# Patient Record
Sex: Male | Born: 1955 | Race: Black or African American | Hispanic: No | Marital: Married | Smoking: Former smoker
Health system: Southern US, Community
[De-identification: ages and names within clinical notes are randomized; demographics above are authoritative.]

## PROBLEM LIST (undated history)

## (undated) DIAGNOSIS — M109 Gout, unspecified: Secondary | ICD-10-CM

## (undated) DIAGNOSIS — I739 Peripheral vascular disease, unspecified: Secondary | ICD-10-CM

## (undated) DIAGNOSIS — I10 Essential (primary) hypertension: Secondary | ICD-10-CM

## (undated) HISTORY — DX: Peripheral vascular disease, unspecified: I73.9

## (undated) HISTORY — PX: COSMETIC SURGERY: SHX468

## (undated) SURGERY — Surgical Case
Anesthesia: *Unknown

---

## 1999-04-12 ENCOUNTER — Emergency Department (HOSPITAL_COMMUNITY): Admission: EM | Admit: 1999-04-12 | Discharge: 1999-04-12 | Payer: Self-pay | Admitting: Emergency Medicine

## 1999-09-05 ENCOUNTER — Emergency Department (HOSPITAL_COMMUNITY): Admission: EM | Admit: 1999-09-05 | Discharge: 1999-09-05 | Payer: Self-pay | Admitting: Emergency Medicine

## 2004-05-10 ENCOUNTER — Inpatient Hospital Stay (HOSPITAL_COMMUNITY): Admission: EM | Admit: 2004-05-10 | Discharge: 2004-05-11 | Payer: Self-pay | Admitting: Emergency Medicine

## 2004-05-10 ENCOUNTER — Encounter: Payer: Self-pay | Admitting: Emergency Medicine

## 2004-06-05 ENCOUNTER — Ambulatory Visit (HOSPITAL_COMMUNITY): Admission: RE | Admit: 2004-06-05 | Discharge: 2004-06-05 | Payer: Self-pay | Admitting: Otolaryngology

## 2004-06-27 ENCOUNTER — Ambulatory Visit (HOSPITAL_COMMUNITY): Admission: RE | Admit: 2004-06-27 | Discharge: 2004-06-27 | Payer: Self-pay | Admitting: Otolaryngology

## 2004-07-10 ENCOUNTER — Ambulatory Visit (HOSPITAL_BASED_OUTPATIENT_CLINIC_OR_DEPARTMENT_OTHER): Admission: RE | Admit: 2004-07-10 | Discharge: 2004-07-10 | Payer: Self-pay | Admitting: Otolaryngology

## 2004-07-10 ENCOUNTER — Ambulatory Visit (HOSPITAL_COMMUNITY): Admission: RE | Admit: 2004-07-10 | Discharge: 2004-07-10 | Payer: Self-pay | Admitting: Otolaryngology

## 2006-05-21 ENCOUNTER — Ambulatory Visit: Payer: Self-pay | Admitting: Internal Medicine

## 2006-06-21 ENCOUNTER — Ambulatory Visit: Payer: Self-pay | Admitting: Internal Medicine

## 2006-07-29 ENCOUNTER — Ambulatory Visit: Payer: Self-pay | Admitting: *Deleted

## 2007-07-27 ENCOUNTER — Emergency Department (HOSPITAL_COMMUNITY): Admission: EM | Admit: 2007-07-27 | Discharge: 2007-07-27 | Payer: Self-pay | Admitting: Emergency Medicine

## 2007-08-12 ENCOUNTER — Emergency Department (HOSPITAL_COMMUNITY): Admission: EM | Admit: 2007-08-12 | Discharge: 2007-08-12 | Payer: Self-pay | Admitting: Emergency Medicine

## 2009-02-16 ENCOUNTER — Ambulatory Visit: Payer: Self-pay | Admitting: Internal Medicine

## 2009-02-24 ENCOUNTER — Ambulatory Visit: Payer: Self-pay | Admitting: Internal Medicine

## 2009-02-24 LAB — CONVERTED CEMR LAB
BUN: 12 mg/dL (ref 6–23)
Basophils Relative: 1 % (ref 0–1)
Chloride: 103 meq/L (ref 96–112)
Eosinophils Absolute: 0.2 10*3/uL (ref 0.0–0.7)
Glucose, Bld: 99 mg/dL (ref 70–99)
LDL Cholesterol: 204 mg/dL — ABNORMAL HIGH (ref 0–99)
Lymphocytes Relative: 35 % (ref 12–46)
MCHC: 31.7 g/dL (ref 30.0–36.0)
Neutro Abs: 2.2 10*3/uL (ref 1.7–7.7)
Sodium: 139 meq/L (ref 135–145)
Total CHOL/HDL Ratio: 6.9
Total Protein: 6.9 g/dL (ref 6.0–8.3)
Triglycerides: 218 mg/dL — ABNORMAL HIGH (ref <150)
WBC: 4.2 10*3/uL (ref 4.0–10.5)

## 2009-03-03 ENCOUNTER — Ambulatory Visit: Payer: Self-pay | Admitting: Family Medicine

## 2009-05-23 ENCOUNTER — Encounter (INDEPENDENT_AMBULATORY_CARE_PROVIDER_SITE_OTHER): Payer: Self-pay | Admitting: Internal Medicine

## 2009-05-23 ENCOUNTER — Ambulatory Visit: Payer: Self-pay | Admitting: Family Medicine

## 2009-05-23 LAB — CONVERTED CEMR LAB
Cholesterol: 261 mg/dL — ABNORMAL HIGH (ref 0–200)
HDL: 43 mg/dL (ref >39)
LDL Cholesterol: 176 mg/dL — ABNORMAL HIGH (ref 0–99)
Total CHOL/HDL Ratio: 6.1

## 2009-05-30 ENCOUNTER — Ambulatory Visit: Payer: Self-pay | Admitting: Family Medicine

## 2009-08-23 ENCOUNTER — Ambulatory Visit: Payer: Self-pay | Admitting: Internal Medicine

## 2009-08-23 LAB — CONVERTED CEMR LAB
Cholesterol: 264 mg/dL — ABNORMAL HIGH (ref 0–200)
HDL: 40 mg/dL (ref >39)
Triglycerides: 232 mg/dL — ABNORMAL HIGH (ref <150)

## 2009-11-03 ENCOUNTER — Encounter (INDEPENDENT_AMBULATORY_CARE_PROVIDER_SITE_OTHER): Payer: Self-pay | Admitting: Internal Medicine

## 2009-11-03 LAB — CONVERTED CEMR LAB
BUN: 13 mg/dL (ref 6–23)
Cholesterol: 276 mg/dL — ABNORMAL HIGH (ref 0–200)
Creatinine, Ser: 1.17 mg/dL (ref 0.40–1.50)
HDL: 40 mg/dL (ref >39)
LDL Cholesterol: 181 mg/dL — ABNORMAL HIGH (ref 0–99)
Sodium: 140 meq/L (ref 135–145)
Triglycerides: 273 mg/dL — ABNORMAL HIGH (ref <150)
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
## 2014-06-03 ENCOUNTER — Encounter (HOSPITAL_COMMUNITY): Payer: Self-pay | Admitting: *Deleted

## 2014-06-03 ENCOUNTER — Emergency Department (HOSPITAL_COMMUNITY)
Admission: EM | Admit: 2014-06-03 | Discharge: 2014-06-03 | Disposition: A | Discharge status: Home / Self Care | Payer: 59 | Source: Home / Self Care | Attending: Family Medicine | Admitting: Family Medicine

## 2014-06-03 DIAGNOSIS — S39012A Strain of muscle, fascia and tendon of lower back, initial encounter: Secondary | ICD-10-CM

## 2014-06-03 MED ORDER — DICLOFENAC POTASSIUM 50 MG PO TABS: 50.0000 mg | ORAL_TABLET | Freq: Three times a day (TID) | ORAL | Status: DC

## 2014-06-03 MED ORDER — KETOROLAC TROMETHAMINE 30 MG/ML IJ SOLN
30.0000 mg | Freq: Once | INTRAMUSCULAR | Status: AC
  Administered 2014-06-03: 30 mg via INTRAMUSCULAR

## 2014-06-03 MED ORDER — CYCLOBENZAPRINE HCL 5 MG PO TABS: 5.0000 mg | ORAL_TABLET | Freq: Three times a day (TID) | ORAL | Status: DC | PRN

## 2014-06-03 MED ORDER — KETOROLAC TROMETHAMINE 30 MG/ML IJ SOLN
INTRAMUSCULAR | Status: AC
  Filled 2014-06-03: qty 1

## 2014-06-03 NOTE — ED Provider Notes (Signed)
CSN: [id number]     Arrival date & time 06/03/14  1502 History   First MD Initiated Contact with Patient 06/03/14 1605     Chief Complaint  Patient presents with  . Back Pain   (Consider location/radiation/quality/duration/timing/severity/associated sxs/prior Treatment) Patient is a 59 y.o. male presenting with back pain. The history is provided by the patient.  Back Pain Location:  Lumbar spine Quality:  Stiffness and shooting Radiates to:  Does not radiate Pain severity:  Moderate Progression:  Unchanged Chronicity:  New Context: lifting heavy objects   Context comment:  Bending over at work last night and pushing something onto a fork lift and felt pain in right lower back, used heat but still sore today, told he can do light duty at work. Relieved by:  None tried Worsened by:  Nothing tried Ineffective treatments:  Heating pad Associated symptoms: no abdominal pain, no bladder incontinence, no bowel incontinence, no chest pain, no leg pain, no numbness, no paresthesias, no tingling and no weakness     History reviewed. No pertinent past medical history. History reviewed. No pertinent past surgical history. History reviewed. No pertinent family history. History  Substance Use Topics  . Smoking status: Not on file  . Smokeless tobacco: Not on file  . Alcohol Use: No    Review of Systems  Constitutional: Negative.   Cardiovascular: Negative for chest pain.  Gastrointestinal: Negative.  Negative for abdominal pain and bowel incontinence.  Genitourinary: Negative.  Negative for bladder incontinence.  Musculoskeletal: Positive for myalgias, back pain and gait problem. Negative for neck pain.  Skin: Negative.   Neurological: Negative for tingling, weakness, numbness and paresthesias.    Allergies  Review of patient's allergies indicates no known allergies.  Home Medications   Prior to Admission medications   Medication Sig Start Date End Date Taking? Authorizing Provider   cyclobenzaprine (FLEXERIL) 5 MG tablet Take 1 tablet (5 mg total) by mouth 3 (three) times daily as needed for muscle spasms. 06/03/14   Linna HoffJames D Jeshawn Melucci, MD  diclofenac (CATAFLAM) 50 MG tablet Take 1 tablet (50 mg total) by mouth 3 (three) times daily. 06/03/14   Linna HoffJames D Burke Terry, MD   There were no vitals taken for this visit. Physical Exam  Constitutional: He is oriented to person, place, and time. He appears well-developed and well-nourished.  Neck: Normal range of motion. Neck supple.  Pulmonary/Chest: Effort normal and breath sounds normal.  Abdominal: Soft. Bowel sounds are normal.  Musculoskeletal: He exhibits tenderness.       Lumbar back: He exhibits decreased range of motion, tenderness, pain and spasm. He exhibits no bony tenderness, no swelling and normal pulse.       Back:  Lymphadenopathy:    He has no cervical adenopathy.  Neurological: He is alert and oriented to person, place, and time.  Skin: Skin is warm and dry.  Nursing note and vitals reviewed.   ED Course  Procedures (including critical care time) Labs Review Labs Reviewed - No data to display  Imaging Review No results found.   MDM   1. Low back strain, initial encounter        Linna HoffJames D Gibbs Naugle, MD 06/03/14 [phone]

## 2014-06-03 NOTE — ED Notes (Signed)
Pt  Reports  Back  Pain        Yesterday         denys  Any  specefic  Injury                 WALKS   BENT  OVER         WITH    SLOW   STEADY  GAIT

## 2014-06-03 NOTE — Discharge Instructions (Signed)
Heat and medicine and activity as tolerated. Return if needed

## 2014-06-04 LAB — POCT RAPID STREP A: STREPTOCOCCUS, GROUP A SCREEN (DIRECT): NEGATIVE

## 2014-06-26 ENCOUNTER — Emergency Department (HOSPITAL_COMMUNITY)
Admission: EM | Admit: 2014-06-26 | Discharge: 2014-06-26 | Disposition: A | Discharge status: Home / Self Care | Payer: 59 | Attending: Emergency Medicine | Admitting: Emergency Medicine

## 2014-06-26 ENCOUNTER — Emergency Department (HOSPITAL_COMMUNITY): Payer: 59

## 2014-06-26 ENCOUNTER — Encounter (HOSPITAL_COMMUNITY): Payer: Self-pay | Admitting: *Deleted

## 2014-06-26 DIAGNOSIS — Z720 Tobacco use: Secondary | ICD-10-CM | POA: Insufficient documentation

## 2014-06-26 DIAGNOSIS — M79671 Pain in right foot: Secondary | ICD-10-CM | POA: Diagnosis not present

## 2014-06-26 DIAGNOSIS — Z791 Long term (current) use of non-steroidal anti-inflammatories (NSAID): Secondary | ICD-10-CM | POA: Insufficient documentation

## 2014-06-26 MED ORDER — INDOMETHACIN 50 MG PO CAPS: 50.0000 mg | ORAL_CAPSULE | Freq: Three times a day (TID) | ORAL | Status: DC

## 2014-06-26 MED ORDER — PREDNISONE 20 MG PO TABS
60.0000 mg | ORAL_TABLET | Freq: Once | ORAL | Status: AC
  Administered 2014-06-26: 60 mg via ORAL
  Filled 2014-06-26: qty 3

## 2014-06-26 MED ORDER — OXYCODONE-ACETAMINOPHEN 5-325 MG PO TABS
1.0000 | ORAL_TABLET | Freq: Once | ORAL | Status: AC
  Administered 2014-06-26: 1 via ORAL
  Filled 2014-06-26: qty 1

## 2014-06-26 MED ORDER — COLCHICINE 0.6 MG PO TABS: 0.6000 mg | ORAL_TABLET | Freq: Every day | ORAL | Status: DC
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
## 2014-06-26 NOTE — ED Notes (Signed)
Pt c/o pain to R great toe since yesterday.  Joint swollen, red and warm.

## 2014-06-26 NOTE — ED Notes (Signed)
Pt stable, ambulatory, states understanding of discharge instructions 

## 2014-06-26 NOTE — Discharge Instructions (Signed)

## 2016-05-22 ENCOUNTER — Emergency Department (HOSPITAL_COMMUNITY): Payer: BLUE CROSS/BLUE SHIELD

## 2016-05-22 ENCOUNTER — Encounter (HOSPITAL_COMMUNITY): Payer: Self-pay | Admitting: Emergency Medicine

## 2016-05-22 ENCOUNTER — Emergency Department (HOSPITAL_COMMUNITY)
Admission: EM | Admit: 2016-05-22 | Discharge: 2016-05-23 | Disposition: A | Discharge status: Home / Self Care | Payer: BLUE CROSS/BLUE SHIELD | Attending: Emergency Medicine | Admitting: Emergency Medicine

## 2016-05-22 DIAGNOSIS — M79671 Pain in right foot: Secondary | ICD-10-CM | POA: Diagnosis not present

## 2016-05-22 DIAGNOSIS — F17210 Nicotine dependence, cigarettes, uncomplicated: Secondary | ICD-10-CM | POA: Diagnosis not present

## 2016-05-22 DIAGNOSIS — I10 Essential (primary) hypertension: Secondary | ICD-10-CM | POA: Diagnosis not present

## 2016-05-22 DIAGNOSIS — Z79899 Other long term (current) drug therapy: Secondary | ICD-10-CM | POA: Insufficient documentation

## 2016-05-22 HISTORY — DX: Gout, unspecified: M10.9

## 2016-05-22 HISTORY — DX: Essential (primary) hypertension: I10

## 2016-05-22 MED ORDER — INDOMETHACIN 50 MG PO CAPS: 50.0000 mg | ORAL_CAPSULE | Freq: Two times a day (BID) | ORAL | 0 refills | Status: DC

## 2016-05-22 MED ORDER — HYDROCODONE-ACETAMINOPHEN 5-325 MG PO TABS
1.0000 | ORAL_TABLET | Freq: Once | ORAL | Status: AC
Start: 2016-05-22 — End: 2016-05-22
  Administered 2016-05-22: 1 via ORAL
  Filled 2016-05-22: qty 1

## 2016-05-22 MED ORDER — COLCHICINE 0.6 MG PO TABS: 0.6000 mg | ORAL_TABLET | Freq: Every day | ORAL | 0 refills | Status: DC

## 2016-05-22 NOTE — ED Notes (Signed)
Patient transported to X-ray 
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
## 2016-05-22 NOTE — ED Triage Notes (Signed)
Pt c/o 9/10 right foot pain on the great toe, pt states he has hx of gout and feels like that pain, denies any injury.

## 2016-08-11 ENCOUNTER — Ambulatory Visit (HOSPITAL_COMMUNITY)
Admission: EM | Admit: 2016-08-11 | Discharge: 2016-08-11 | Disposition: A | Discharge status: Home / Self Care | Payer: BLUE CROSS/BLUE SHIELD | Attending: Internal Medicine | Admitting: Internal Medicine

## 2016-08-11 ENCOUNTER — Encounter (HOSPITAL_COMMUNITY): Payer: Self-pay

## 2016-08-11 DIAGNOSIS — S39012A Strain of muscle, fascia and tendon of lower back, initial encounter: Secondary | ICD-10-CM

## 2016-08-11 DIAGNOSIS — M79604 Pain in right leg: Secondary | ICD-10-CM

## 2016-08-11 MED ORDER — KETOROLAC TROMETHAMINE 60 MG/2ML IM SOLN
INTRAMUSCULAR | Status: AC
  Filled 2016-08-11: qty 2

## 2016-08-11 MED ORDER — CYCLOBENZAPRINE HCL 10 MG PO TABS: 10.0000 mg | ORAL_TABLET | Freq: Every day | ORAL | 0 refills | Status: DC

## 2016-08-11 MED ORDER — NAPROXEN 500 MG PO TABS: 500.0000 mg | ORAL_TABLET | Freq: Two times a day (BID) | ORAL | 0 refills | Status: AC

## 2016-08-11 MED ORDER — KETOROLAC TROMETHAMINE 60 MG/2ML IM SOLN
60.0000 mg | Freq: Once | INTRAMUSCULAR | Status: AC
  Administered 2016-08-11: 60 mg via INTRAMUSCULAR
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
## 2016-08-11 NOTE — Discharge Instructions (Signed)
Your exam is most consistent with muscle strain. Start naproxen 500 mg twice a day for 10 days. Start Flexeril 10 mg at night as needed for muscle spasms. Back brace to help with the pain. Activity as tolerated. Muscle strains can take up to 2-3 weeks to resolve, but he should be feeling better each day. Follow-up with PCP if symptoms do not resolve. Monitor for loss of bladder or bowel control, numbness of the inner thighs, to go to the ED immediately.

## 2016-08-11 NOTE — ED Triage Notes (Signed)
Patient presents today with right leg pain that started 2 days ago when picking up a board. Felt a little pain on the right lower part of his back when he was bending over and the next day the pain was shooting down his right leg. Has tried OTC tylenol that does not help with relief. Can barely walk without pain and has been unable to sleep for the past 2 nights.

## 2019-10-05 ENCOUNTER — Encounter (HOSPITAL_COMMUNITY): Payer: Self-pay | Admitting: Urgent Care

## 2019-10-05 ENCOUNTER — Ambulatory Visit (INDEPENDENT_AMBULATORY_CARE_PROVIDER_SITE_OTHER): Payer: 59

## 2019-10-05 ENCOUNTER — Ambulatory Visit (HOSPITAL_COMMUNITY)
Admission: EM | Admit: 2019-10-05 | Discharge: 2019-10-05 | Disposition: A | Discharge status: Home / Self Care | Payer: 59 | Attending: Urgent Care | Admitting: Urgent Care

## 2019-10-05 ENCOUNTER — Other Ambulatory Visit: Payer: Self-pay

## 2019-10-05 DIAGNOSIS — M7989 Other specified soft tissue disorders: Secondary | ICD-10-CM | POA: Diagnosis not present

## 2019-10-05 DIAGNOSIS — M79675 Pain in left toe(s): Secondary | ICD-10-CM | POA: Diagnosis not present

## 2019-10-05 DIAGNOSIS — Z8739 Personal history of other diseases of the musculoskeletal system and connective tissue: Secondary | ICD-10-CM

## 2019-10-05 MED ORDER — PREDNISONE 20 MG PO TABS: ORAL_TABLET | ORAL | 0 refills | Status: DC

## 2019-10-05 NOTE — ED Triage Notes (Signed)
Pt presents with left little toe pain x 1 month. Pain worsen when walking.
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
## 2019-12-08 ENCOUNTER — Emergency Department (HOSPITAL_COMMUNITY)
Admission: EM | Admit: 2019-12-08 | Discharge: 2019-12-09 | Disposition: A | Discharge status: Home / Self Care | Payer: 59 | Attending: Emergency Medicine | Admitting: Emergency Medicine

## 2019-12-08 ENCOUNTER — Other Ambulatory Visit: Payer: Self-pay

## 2019-12-08 DIAGNOSIS — I739 Peripheral vascular disease, unspecified: Secondary | ICD-10-CM

## 2019-12-08 DIAGNOSIS — F17210 Nicotine dependence, cigarettes, uncomplicated: Secondary | ICD-10-CM | POA: Insufficient documentation

## 2019-12-08 DIAGNOSIS — Z20822 Contact with and (suspected) exposure to covid-19: Secondary | ICD-10-CM | POA: Insufficient documentation

## 2019-12-08 DIAGNOSIS — S91105D Unspecified open wound of left lesser toe(s) without damage to nail, subsequent encounter: Secondary | ICD-10-CM | POA: Insufficient documentation

## 2019-12-08 DIAGNOSIS — X58XXXD Exposure to other specified factors, subsequent encounter: Secondary | ICD-10-CM | POA: Insufficient documentation

## 2019-12-08 DIAGNOSIS — T148XXA Other injury of unspecified body region, initial encounter: Secondary | ICD-10-CM

## 2019-12-08 DIAGNOSIS — S91109A Unspecified open wound of unspecified toe(s) without damage to nail, initial encounter: Secondary | ICD-10-CM

## 2019-12-08 DIAGNOSIS — I10 Essential (primary) hypertension: Secondary | ICD-10-CM | POA: Diagnosis not present

## 2019-12-08 DIAGNOSIS — I998 Other disorder of circulatory system: Secondary | ICD-10-CM | POA: Diagnosis not present

## 2019-12-08 DIAGNOSIS — I70222 Atherosclerosis of native arteries of extremities with rest pain, left leg: Secondary | ICD-10-CM | POA: Diagnosis not present

## 2019-12-08 NOTE — ED Triage Notes (Signed)
Onset 1 month sore between left pinky toe and 4th toe.  Was seen for same.  Pt now c/os of foot pain all over.  Great toe and 2nd toe reddened, swollen.   + pedal pulse.  Left lower leg feels cooler to touch than right lower leg.

## 2019-12-09 ENCOUNTER — Other Ambulatory Visit: Payer: Self-pay

## 2019-12-09 ENCOUNTER — Emergency Department (HOSPITAL_COMMUNITY): Payer: 59

## 2019-12-09 DIAGNOSIS — I998 Other disorder of circulatory system: Secondary | ICD-10-CM

## 2019-12-09 DIAGNOSIS — I70222 Atherosclerosis of native arteries of extremities with rest pain, left leg: Secondary | ICD-10-CM

## 2019-12-09 LAB — BASIC METABOLIC PANEL
Anion gap: 10 (ref 5–15)
BUN: 13 mg/dL (ref 8–23)
CO2: 25 mmol/L (ref 22–32)
Calcium: 9.2 mg/dL (ref 8.9–10.3)
Chloride: 104 mmol/L (ref 98–111)
Creatinine, Ser: 1.08 mg/dL (ref 0.61–1.24)
GFR, Estimated: >60 mL/min (ref >60)
Glucose, Bld: 99 mg/dL (ref 70–99)
Potassium: 3.8 mmol/L (ref 3.5–5.1)
Sodium: 139 mmol/L (ref 135–145)

## 2019-12-09 LAB — CBC WITH DIFFERENTIAL/PLATELET
Abs Immature Granulocytes: 0.01 10*3/uL (ref 0.00–0.07)
Basophils Absolute: 0.1 10*3/uL (ref 0.0–0.1)
Basophils Relative: 1 %
Eosinophils Absolute: 0.3 10*3/uL (ref 0.0–0.5)
Eosinophils Relative: 3 %
HCT: 39.9 % (ref 39.0–52.0)
Hemoglobin: 12.9 g/dL — ABNORMAL LOW (ref 13.0–17.0)
Immature Granulocytes: 0 %
Lymphocytes Relative: 34 %
Lymphs Abs: 2.6 10*3/uL (ref 0.7–4.0)
MCH: 31.1 pg (ref 26.0–34.0)
MCHC: 32.3 g/dL (ref 30.0–36.0)
MCV: 96.1 fL (ref 80.0–100.0)
Monocytes Absolute: 0.4 10*3/uL (ref 0.1–1.0)
Monocytes Relative: 5 %
Neutro Abs: 4.3 10*3/uL (ref 1.7–7.7)
Neutrophils Relative %: 57 %
Platelets: 319 10*3/uL (ref 150–400)
RBC: 4.15 MIL/uL — ABNORMAL LOW (ref 4.22–5.81)
RDW: 13.6 % (ref 11.5–15.5)
WBC: 7.6 10*3/uL (ref 4.0–10.5)
nRBC: 0 % (ref 0.0–0.2)

## 2019-12-09 LAB — RESPIRATORY PANEL BY RT PCR (FLU A&B, COVID)
Influenza A by PCR: NEGATIVE
Influenza B by PCR: NEGATIVE
SARS Coronavirus 2 by RT PCR: NEGATIVE

## 2019-12-09 LAB — LACTIC ACID, PLASMA: Lactic Acid, Venous: 1 mmol/L (ref 0.5–1.9)

## 2019-12-09 MED ORDER — OXYCODONE-ACETAMINOPHEN 5-325 MG PO TABS
1.0000 | ORAL_TABLET | Freq: Once | ORAL | Status: AC
  Administered 2019-12-09: 1 via ORAL
  Filled 2019-12-09: qty 1

## 2019-12-09 MED ORDER — OXYCODONE-ACETAMINOPHEN 5-325 MG PO TABS
1.0000 | ORAL_TABLET | Freq: Four times a day (QID) | ORAL | 0 refills | Status: DC | PRN
Start: 2019-12-09 — End: 2019-12-10

## 2019-12-09 NOTE — ED Notes (Signed)
Pt transported to XRAY °
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
## 2019-12-09 NOTE — H&P (View-Only) (Signed)
Vascular and Vein Specialist  Patient name: [name] MRN: [id number] DOB: 08-Nov-1955 Sex: male  REASON FOR CONSULT: Rest pain and tissue loss left foot  HPI: [name] is a 64 y.o. male, presented to the emergency room with increasing pain in his left foot.  This is been present for approximately 1-1/2 to 2 months.  He had presented to urgent care in mid September with similar issues and was felt to have gout.  He does have a history of gout.  This has been progressive and he is now developed an ulceration on the tip of his left fifth toe.  His pain has become increasingly severe and he presented for reevaluation.  He reports classic rest pain.  He is unable to sleep and has to dangle his foot over the edge of the bed.  He has no symptoms in his left leg.  He has no history of cardiac disease.  Does have a prior history of hypertension and is a long-term cigarette smoker.  Past Medical History:  Diagnosis Date  . Gout   . Hypertension     No family history on file.  SOCIAL HISTORY: Social History   Socioeconomic History  . Marital status: Legally Separated    Spouse name: Not on file  . Number of children: Not on file  . Years of education: Not on file  . Highest education level: Not on file  Occupational History  . Not on file  Tobacco Use  . Smoking status: Current Every Day Smoker    Packs/day: 1.00    Types: Cigarettes  . Smokeless tobacco: Never Used  Vaping Use  . Vaping Use: Never used  Substance and Sexual Activity  . Alcohol use: No  . Drug use: No    Comment: Pt vapes   . Sexual activity: Not on file  Other Topics Concern  . Not on file  Social History Narrative  . Not on file   Social Determinants of Health   Financial Resource Strain:   . Difficulty of Paying Living Expenses: Not on file  Food Insecurity:   . Worried About Programme researcher, broadcasting/film/video in the Last Year: Not on file  . Ran Out of Food in the Last Year: Not on file  Transportation  Needs:   . Lack of Transportation (Medical): Not on file  . Lack of Transportation (Non-Medical): Not on file  Physical Activity:   . Days of Exercise per Week: Not on file  . Minutes of Exercise per Session: Not on file  Stress:   . Feeling of Stress : Not on file  Social Connections:   . Frequency of Communication with Friends and Family: Not on file  . Frequency of Social Gatherings with Friends and Family: Not on file  . Attends Religious Services: Not on file  . Active Member of Clubs or Organizations: Not on file  . Attends Banker Meetings: Not on file  . Marital Status: Not on file  Intimate Partner Violence:   . Fear of Current or Ex-Partner: Not on file  . Emotionally Abused: Not on file  . Physically Abused: Not on file  . Sexually Abused: Not on file    No Known Allergies  No current facility-administered medications for this encounter.   Current Outpatient Medications  Medication Sig Dispense Refill  . colchicine 0.6 MG tablet Take 1 tablet (0.6 mg total) by mouth daily. 10 tablet 0  . cyclobenzaprine (FLEXERIL) 10 MG  tablet Take 1 tablet (10 mg total) by mouth at bedtime. 10 tablet 0  . indomethacin (INDOCIN) 50 MG capsule Take 1 capsule (50 mg total) by mouth 2 (two) times daily with a meal. 20 capsule 0  . predniSONE (DELTASONE) 20 MG tablet Take 2 tablets daily with breakfast. 10 tablet 0    REVIEW OF SYSTEMS:  [X]  denotes positive finding, [ ]  denotes negative finding Cardiac  Comments:  Chest pain or chest pressure:    Shortness of breath upon exertion:    Short of breath when lying flat:    Irregular heart rhythm:        Vascular    Pain in calf, thigh, or hip brought on by ambulation: x   Pain in feet at night that wakes you up from your sleep:  x   Blood clot in your veins:    Leg swelling:         Pulmonary    Oxygen at home:    Productive cough:     Wheezing:         Neurologic    Sudden weakness in arms or legs:     Sudden  numbness in arms or legs:     Sudden onset of difficulty speaking or slurred speech:    Temporary loss of vision in one eye:     Problems with dizziness:         Gastrointestinal    Blood in stool:     Vomited blood:         Genitourinary    Burning when urinating:     Blood in urine:        Psychiatric    Major depression:         Hematologic    Bleeding problems:    Problems with blood clotting too easily:        Skin    Rashes or ulcers:        Constitutional    Fever or chills:      PHYSICAL EXAM: Vitals:   12/08/19 2157 12/09/19 0122 12/09/19 0520  BP: (!) 149/87 (!) 133/95 (!) 149/103  Pulse: 81 67 66  Resp: 16 18 (!) 22  Temp: 97.9 F (36.6 C)  97.7 F (36.5 C)  TempSrc: Oral  Oral  SpO2: 99% 100% 98%    GENERAL: The patient is a well-nourished male, in no acute distress. The vital signs are documented above. VASCULAR: 2+ radial pulses bilaterally.  2+ right femoral pulse.  I do not palpate right popliteal or distal pulses.  He does have absent femoral pulse on the left. PULMONARY: There is good air exchange ABDOMEN: Soft and non-tender  MUSCULOSKELETAL: There are no major deformities or cyanosis. NEUROLOGIC: No focal weakness or paresthesias are detected. SKIN: Dependent rubor in his left foot.  He does have a superficial ulceration on the tip of his left fifth toe PSYCHIATRIC: The patient has a normal affect.  DATA:   Normal creatinine  MEDICAL ISSUES:  Critical limb ischemia which has been progressive.  This is not acute.  He will be discharged from the emergency room today with narcotic pain medication provided by Dr. 13/10/21.  My office will contact him later today.  It is currently 6 AM.  My office will contact him later today and we will get him on the schedule most likely tomorrow or Friday.  He will be Covid tested today for procedure tomorrow.  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  Explained the procedure of diagnostic arteriogram most likely through the right femoral approach and hopeful endovascular treatment of his iliac disease.  Also explained may require bypass and this would be determined by his arteriogram.   Larina Earthly, MD FACS [facility] [place] Office phone [phone]

## 2019-12-09 NOTE — Discharge Instructions (Addendum)
Follow-up with Dr. Bosie Helper office as instructed.

## 2019-12-09 NOTE — Consult Note (Signed)
Vascular and Vein Specialist  Patient name: [name] MRN: [id number] DOB: 08-Nov-1955 Sex: male  REASON FOR CONSULT: Rest pain and tissue loss left foot  HPI: [name] is a 64 y.o. male, presented to the emergency room with increasing pain in his left foot.  This is been present for approximately 1-1/2 to 2 months.  He had presented to urgent care in mid September with similar issues and was felt to have gout.  He does have a history of gout.  This has been progressive and he is now developed an ulceration on the tip of his left fifth toe.  His pain has become increasingly severe and he presented for reevaluation.  He reports classic rest pain.  He is unable to sleep and has to dangle his foot over the edge of the bed.  He has no symptoms in his left leg.  He has no history of cardiac disease.  Does have a prior history of hypertension and is a long-term cigarette smoker.  Past Medical History:  Diagnosis Date  . Gout   . Hypertension     No family history on file.  SOCIAL HISTORY: Social History   Socioeconomic History  . Marital status: Legally Separated    Spouse name: Not on file  . Number of children: Not on file  . Years of education: Not on file  . Highest education level: Not on file  Occupational History  . Not on file  Tobacco Use  . Smoking status: Current Every Day Smoker    Packs/day: 1.00    Types: Cigarettes  . Smokeless tobacco: Never Used  Vaping Use  . Vaping Use: Never used  Substance and Sexual Activity  . Alcohol use: No  . Drug use: No    Comment: Pt vapes   . Sexual activity: Not on file  Other Topics Concern  . Not on file  Social History Narrative  . Not on file   Social Determinants of Health   Financial Resource Strain:   . Difficulty of Paying Living Expenses: Not on file  Food Insecurity:   . Worried About Programme researcher, broadcasting/film/video in the Last Year: Not on file  . Ran Out of Food in the Last Year: Not on file  Transportation  Needs:   . Lack of Transportation (Medical): Not on file  . Lack of Transportation (Non-Medical): Not on file  Physical Activity:   . Days of Exercise per Week: Not on file  . Minutes of Exercise per Session: Not on file  Stress:   . Feeling of Stress : Not on file  Social Connections:   . Frequency of Communication with Friends and Family: Not on file  . Frequency of Social Gatherings with Friends and Family: Not on file  . Attends Religious Services: Not on file  . Active Member of Clubs or Organizations: Not on file  . Attends Banker Meetings: Not on file  . Marital Status: Not on file  Intimate Partner Violence:   . Fear of Current or Ex-Partner: Not on file  . Emotionally Abused: Not on file  . Physically Abused: Not on file  . Sexually Abused: Not on file    No Known Allergies  No current facility-administered medications for this encounter.   Current Outpatient Medications  Medication Sig Dispense Refill  . colchicine 0.6 MG tablet Take 1 tablet (0.6 mg total) by mouth daily. 10 tablet 0  . cyclobenzaprine (FLEXERIL) 10 MG  tablet Take 1 tablet (10 mg total) by mouth at bedtime. 10 tablet 0  . indomethacin (INDOCIN) 50 MG capsule Take 1 capsule (50 mg total) by mouth 2 (two) times daily with a meal. 20 capsule 0  . predniSONE (DELTASONE) 20 MG tablet Take 2 tablets daily with breakfast. 10 tablet 0    REVIEW OF SYSTEMS:  [X]  denotes positive finding, [ ]  denotes negative finding Cardiac  Comments:  Chest pain or chest pressure:    Shortness of breath upon exertion:    Short of breath when lying flat:    Irregular heart rhythm:        Vascular    Pain in calf, thigh, or hip brought on by ambulation: x   Pain in feet at night that wakes you up from your sleep:  x   Blood clot in your veins:    Leg swelling:         Pulmonary    Oxygen at home:    Productive cough:     Wheezing:         Neurologic    Sudden weakness in arms or legs:     Sudden  numbness in arms or legs:     Sudden onset of difficulty speaking or slurred speech:    Temporary loss of vision in one eye:     Problems with dizziness:         Gastrointestinal    Blood in stool:     Vomited blood:         Genitourinary    Burning when urinating:     Blood in urine:        Psychiatric    Major depression:         Hematologic    Bleeding problems:    Problems with blood clotting too easily:        Skin    Rashes or ulcers:        Constitutional    Fever or chills:      PHYSICAL EXAM: Vitals:   12/08/19 2157 12/09/19 0122 12/09/19 0520  BP: (!) 149/87 (!) 133/95 (!) 149/103  Pulse: 81 67 66  Resp: 16 18 (!) 22  Temp: 97.9 F (36.6 C)  97.7 F (36.5 C)  TempSrc: Oral  Oral  SpO2: 99% 100% 98%    GENERAL: The patient is a well-nourished male, in no acute distress. The vital signs are documented above. VASCULAR: 2+ radial pulses bilaterally.  2+ right femoral pulse.  I do not palpate right popliteal or distal pulses.  He does have absent femoral pulse on the left. PULMONARY: There is good air exchange ABDOMEN: Soft and non-tender  MUSCULOSKELETAL: There are no major deformities or cyanosis. NEUROLOGIC: No focal weakness or paresthesias are detected. SKIN: Dependent rubor in his left foot.  He does have a superficial ulceration on the tip of his left fifth toe PSYCHIATRIC: The patient has a normal affect.  DATA:   Normal creatinine  MEDICAL ISSUES:  Critical limb ischemia which has been progressive.  This is not acute.  He will be discharged from the emergency room today with narcotic pain medication provided by Dr. 13/10/21.  My office will contact him later today.  It is currently 6 AM.  My office will contact him later today and we will get him on the schedule most likely tomorrow or Friday.  He will be Covid tested today for procedure tomorrow.  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  Explained the procedure of diagnostic arteriogram most likely through the right femoral approach and hopeful endovascular treatment of his iliac disease.  Also explained may require bypass and this would be determined by his arteriogram.   Larina Earthly, MD FACS [facility] [place] Office phone [phone]

## 2019-12-10 ENCOUNTER — Other Ambulatory Visit: Payer: Self-pay

## 2019-12-10 ENCOUNTER — Encounter (HOSPITAL_COMMUNITY)
Admission: RE | Disposition: A | Discharge status: Home / Self Care | Payer: Self-pay | Source: Home / Self Care | Attending: Vascular Surgery

## 2019-12-10 ENCOUNTER — Other Ambulatory Visit: Payer: Self-pay | Admitting: Vascular Surgery

## 2019-12-10 ENCOUNTER — Ambulatory Visit (HOSPITAL_COMMUNITY)
Admission: RE | Admit: 2019-12-10 | Discharge: 2019-12-10 | Disposition: A | Discharge status: Home / Self Care | Payer: 59 | Attending: Vascular Surgery | Admitting: Vascular Surgery

## 2019-12-10 DIAGNOSIS — Z539 Procedure and treatment not carried out, unspecified reason: Secondary | ICD-10-CM | POA: Diagnosis not present

## 2019-12-10 LAB — POCT I-STAT, CHEM 8
BUN: 15 mg/dL (ref 8–23)
Calcium, Ion: 1.27 mmol/L (ref 1.15–1.40)
Chloride: 103 mmol/L (ref 98–111)
Creatinine, Ser: 1.1 mg/dL (ref 0.61–1.24)
Glucose, Bld: 101 mg/dL — ABNORMAL HIGH (ref 70–99)
HCT: 38 % — ABNORMAL LOW (ref 39.0–52.0)
Hemoglobin: 12.9 g/dL — ABNORMAL LOW (ref 13.0–17.0)
Potassium: 3.5 mmol/L (ref 3.5–5.1)
Sodium: 141 mmol/L (ref 135–145)
TCO2: 24 mmol/L (ref 22–32)

## 2019-12-10 SURGERY — ABDOMINAL AORTOGRAM W/LOWER EXTREMITY
Anesthesia: LOCAL

## 2019-12-10 MED ORDER — HYDROMORPHONE HCL 1 MG/ML IJ SOLN: 0.5000 mg | Freq: Once | INTRAMUSCULAR | Status: DC

## 2019-12-10 MED ORDER — SODIUM CHLORIDE 0.9 % IV SOLN: INTRAVENOUS | Status: DC

## 2019-12-10 MED ORDER — OXYCODONE-ACETAMINOPHEN 5-325 MG PO TABS: 1.0000 | ORAL_TABLET | Freq: Four times a day (QID) | ORAL | 0 refills | Status: DC | PRN

## 2019-12-10 NOTE — Progress Notes (Addendum)
I have offered to re-schedule his arteriogram for tomorrow with Dr. Edilia Bo.  I have an emergency on call and have several cases in front of him as well.  He is willing to come back tomorrow morning.  I have discussed with my office to re-schedule.  Cephus Shelling, MD [facility] [place] Office: [phone]   Cephus Shelling

## 2019-12-10 NOTE — Progress Notes (Signed)
15 percocets sent to pharmacy.  Cephus Shelling, MD [facility] [place] Office: [phone]   Cephus Shelling

## 2019-12-11 ENCOUNTER — Encounter (HOSPITAL_COMMUNITY)
Admission: RE | Disposition: A | Discharge status: Home / Self Care | Payer: Self-pay | Source: Home / Self Care | Attending: Vascular Surgery

## 2019-12-11 ENCOUNTER — Encounter: Payer: Self-pay | Admitting: Vascular Surgery

## 2019-12-11 ENCOUNTER — Other Ambulatory Visit: Payer: Self-pay

## 2019-12-11 ENCOUNTER — Ambulatory Visit (HOSPITAL_COMMUNITY)
Admission: RE | Admit: 2019-12-11 | Discharge: 2019-12-11 | Disposition: A | Discharge status: Home / Self Care | Payer: 59 | Attending: Vascular Surgery | Admitting: Vascular Surgery

## 2019-12-11 DIAGNOSIS — Z79899 Other long term (current) drug therapy: Secondary | ICD-10-CM | POA: Diagnosis not present

## 2019-12-11 DIAGNOSIS — F17210 Nicotine dependence, cigarettes, uncomplicated: Secondary | ICD-10-CM | POA: Diagnosis not present

## 2019-12-11 DIAGNOSIS — Z7952 Long term (current) use of systemic steroids: Secondary | ICD-10-CM | POA: Diagnosis not present

## 2019-12-11 DIAGNOSIS — I70245 Atherosclerosis of native arteries of left leg with ulceration of other part of foot: Secondary | ICD-10-CM | POA: Diagnosis present

## 2019-12-11 DIAGNOSIS — I10 Essential (primary) hypertension: Secondary | ICD-10-CM | POA: Diagnosis not present

## 2019-12-11 DIAGNOSIS — M109 Gout, unspecified: Secondary | ICD-10-CM | POA: Diagnosis not present

## 2019-12-11 DIAGNOSIS — L97529 Non-pressure chronic ulcer of other part of left foot with unspecified severity: Secondary | ICD-10-CM | POA: Insufficient documentation

## 2019-12-11 DIAGNOSIS — I998 Other disorder of circulatory system: Secondary | ICD-10-CM | POA: Diagnosis not present

## 2019-12-11 HISTORY — PX: ABDOMINAL AORTOGRAM W/LOWER EXTREMITY: CATH118223

## 2019-12-11 SURGERY — ABDOMINAL AORTOGRAM W/LOWER EXTREMITY
Anesthesia: LOCAL | Laterality: Bilateral

## 2019-12-11 MED ORDER — MIDAZOLAM HCL 2 MG/2ML IJ SOLN
INTRAMUSCULAR | Status: AC
  Filled 2019-12-11: qty 2

## 2019-12-11 MED ORDER — ONDANSETRON HCL 4 MG/2ML IJ SOLN: 4.0000 mg | Freq: Four times a day (QID) | INTRAMUSCULAR | Status: DC | PRN

## 2019-12-11 MED ORDER — LABETALOL HCL 5 MG/ML IV SOLN: 10.0000 mg | INTRAVENOUS | Status: DC | PRN

## 2019-12-11 MED ORDER — IODIXANOL 320 MG/ML IV SOLN
INTRAVENOUS | Status: DC | PRN
  Administered 2019-12-11: 193 mL via INTRA_ARTERIAL

## 2019-12-11 MED ORDER — HEPARIN (PORCINE) IN NACL 1000-0.9 UT/500ML-% IV SOLN
INTRAVENOUS | Status: AC
  Filled 2019-12-11: qty 1000

## 2019-12-11 MED ORDER — HYDRALAZINE HCL 20 MG/ML IJ SOLN: 5.0000 mg | INTRAMUSCULAR | Status: DC | PRN

## 2019-12-11 MED ORDER — SODIUM CHLORIDE 0.9 % IV SOLN: INTRAVENOUS | Status: DC

## 2019-12-11 MED ORDER — ACETAMINOPHEN 325 MG PO TABS: 650.0000 mg | ORAL_TABLET | ORAL | Status: DC | PRN

## 2019-12-11 MED ORDER — HEPARIN (PORCINE) IN NACL 1000-0.9 UT/500ML-% IV SOLN
INTRAVENOUS | Status: DC | PRN
  Administered 2019-12-11 (×2): 500 mL

## 2019-12-11 MED ORDER — LIDOCAINE HCL (PF) 1 % IJ SOLN
INTRAMUSCULAR | Status: DC | PRN
  Administered 2019-12-11: 17 mL via INTRADERMAL

## 2019-12-11 MED ORDER — LIDOCAINE HCL (PF) 1 % IJ SOLN
INTRAMUSCULAR | Status: AC
  Filled 2019-12-11: qty 30

## 2019-12-11 MED ORDER — OXYCODONE-ACETAMINOPHEN 5-325 MG PO TABS
2.0000 | ORAL_TABLET | Freq: Once | ORAL | Status: AC
  Administered 2019-12-11: 2 via ORAL
  Filled 2019-12-11: qty 2

## 2019-12-11 MED ORDER — MIDAZOLAM HCL 2 MG/2ML IJ SOLN
INTRAMUSCULAR | Status: DC | PRN
  Administered 2019-12-11 (×2): 1 mg via INTRAVENOUS

## 2019-12-11 MED ORDER — SODIUM CHLORIDE 0.9 % WEIGHT BASED INFUSION
1.0000 mL/kg/h | INTRAVENOUS | Status: DC
  Administered 2019-12-11: 1 mL/kg/h via INTRAVENOUS

## 2019-12-11 MED ORDER — HYDRALAZINE HCL 20 MG/ML IJ SOLN
INTRAMUSCULAR | Status: AC
  Filled 2019-12-11: qty 1

## 2019-12-11 MED ORDER — FENTANYL CITRATE (PF) 100 MCG/2ML IJ SOLN
INTRAMUSCULAR | Status: DC | PRN
  Administered 2019-12-11: 50 ug via INTRAVENOUS

## 2019-12-11 MED ORDER — SODIUM CHLORIDE 0.9% FLUSH: 3.0000 mL | INTRAVENOUS | Status: DC | PRN

## 2019-12-11 MED ORDER — FENTANYL CITRATE (PF) 100 MCG/2ML IJ SOLN
INTRAMUSCULAR | Status: AC
  Filled 2019-12-11: qty 2

## 2019-12-11 MED ORDER — SODIUM CHLORIDE 0.9% FLUSH: 3.0000 mL | Freq: Two times a day (BID) | INTRAVENOUS | Status: DC

## 2019-12-11 MED ORDER — HYDRALAZINE HCL 20 MG/ML IJ SOLN
INTRAMUSCULAR | Status: DC | PRN
  Administered 2019-12-11 (×2): 10 mg via INTRAVENOUS

## 2019-12-11 MED ORDER — SODIUM CHLORIDE 0.9 % IV SOLN: 250.0000 mL | INTRAVENOUS | Status: DC | PRN

## 2019-12-11 SURGICAL SUPPLY — 11 items
CATH ANGIO 5F PIGTAIL 65CM (CATHETERS) ×2 IMPLANT
KIT MICROPUNCTURE NIT STIFF (SHEATH) ×2 IMPLANT
KIT PV (KITS) ×2 IMPLANT
SHEATH PINNACLE 5F 10CM (SHEATH) ×2 IMPLANT
SHEATH PROBE COVER 6X72 (BAG) ×2 IMPLANT
STOPCOCK MORSE 400PSI 3WAY (MISCELLANEOUS) ×2 IMPLANT
SYR MEDRAD MARK 7 150ML (SYRINGE) ×2 IMPLANT
TRANSDUCER W/STOPCOCK (MISCELLANEOUS) ×2 IMPLANT
TRAY PV CATH (CUSTOM PROCEDURE TRAY) ×2 IMPLANT
TUBING CIL FLEX 10 FLL-RA (TUBING) ×2 IMPLANT
WIRE BENTSON .035X145CM (WIRE) ×4 IMPLANT

## 2019-12-11 NOTE — Progress Notes (Signed)
Patient ID: [name], male   DOB: 10/01/1955, 64 y.o.   MRN: [id number] Reviewed arteriogram from today.  Discussed with the patient.  Recommend right to left femoral to femoral bypass and left femoral to popliteal bypass.  It appears as though he could have a above-knee bypass.  Saphenous vein appears to be moderate size on the surface.  We will make decision at the time of surgery.  He is having severe rest pain but no change over the past several weeks.  He will be discharged today as planned and will be admitted for surgery on 12/15/2019. 

## 2019-12-11 NOTE — Op Note (Signed)
   PATIENT: [name]      MRN: [id number] DOB: [birth year]    DATE OF PROCEDURE: 12/11/2019  INDICATIONS:    [name] is a 64 y.o. male critical limb ischemia left lower extremity  PROCEDURE:    1.  Conscious sedation 2.  Ultrasound-guided access to the right common femoral artery 3.  Aortogram with bilateral iliac arteriogram and bilateral lower extremity runoff  SURGEON: Di Kindle. Edilia Bo, MD, FACS  ANESTHESIA: Local with sedation  EBL: Minimal  TECHNIQUE: The patient was brought to the peripheral vascular lab and was sedated. The period of conscious sedation was 38 minutes.  During that time period, I was present face-to-face 100% of the time.  The patient was administered 1 mg of Versed and 50 mcg of fentanyl.  He received an additional 1 mg of Versed. The patient's heart rate, blood pressure, and oxygen saturation were monitored by the nurse continuously during the procedure.  Both groins were prepped and draped in the usual sterile fashion.  Under ultrasound guidance, after the skin was anesthetized, I cannulated the right common femoral artery with a micropuncture needle and a micropuncture sheath was introduced over a wire.  This was exchanged for a 5 [place] sheath over a Bentson wire.  By ultrasound the femoral artery was patent. A real-time image was obtained and sent to the server.  A pigtail catheter was positioned at the L1 vertebral body and flush aortogram obtained.  The catheter was in position above the aortic bifurcation and an oblique iliac projection was obtained.  Next bilateral lower extremity runoff films were obtained.  At the completion of the procedure remove the pigtail catheter over a wire.  The sheath was removed and pressure held for hemostasis.  No immediate complications were noted.  FINDINGS:   1.  There are single renal arteries bilaterally with no significant renal artery stenosis.  The infrarenal aorta is widely patent with no areas of  stenosis. 2.  The left common iliac artery, external iliac artery are occluded.  The hypogastric reconstitutes via collaterals.  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  There is reconstitution of the common femoral artery.  The deep femoral artery is patent.  The superficial femoral artery is occluded at its origin.  There is reconstitution of the popliteal artery just above the knee with some disease at the level of the knee.  There is single-vessel runoff on the left via the peroneal artery. 3.  On the right side the common iliac, external iliac, and hypogastric arteries are patent.  The common femoral and deep femoral artery are patent.  The superficial femoral artery is occluded at its origin.  There is reconstitution of the above-knee popliteal artery with two-vessel runoff on the right via the peroneal and posterior tibial arteries.  The anterior tibial artery is occluded.  There is moderate diffuse disease of the tibial peroneal trunk.    Waverly Ferrari, MD, FACS [facility]  DATE OF DICTATION:   12/11/2019

## 2019-12-11 NOTE — Interval H&P Note (Signed)
History and Physical Interval Note:  12/11/2019 9:11 AM  [name]  has presented today for surgery, with the diagnosis of critical limb ischemia.  The various methods of treatment have been discussed with the patient and family. After consideration of risks, benefits and other options for treatment, the patient has consented to  Procedure(s): ABDOMINAL AORTOGRAM W/LOWER EXTREMITY (Bilateral) as a surgical intervention.  The patient's history has been reviewed, patient examined, no change in status, stable for surgery.  I have reviewed the patient's chart and labs.  Questions were answered to the patient's satisfaction.     [name]

## 2019-12-11 NOTE — Discharge Instructions (Signed)
Moderate Conscious Sedation, Adult, Care After These instructions provide you with information about caring for yourself after your procedure. Your health care provider may also give you more specific instructions. Your treatment has been planned according to current medical practices, but problems sometimes occur. Call your health care provider if you have any problems or questions after your procedure. What can I expect after the procedure? After your procedure, it is common:  To feel sleepy for several hours.  To feel clumsy and have poor balance for several hours.  To have poor judgment for several hours.  To vomit if you eat too soon. Follow these instructions at home: For at least 24 hours after the procedure:   Do not: ? Participate in activities where you could fall or become injured. ? Drive. ? Use heavy machinery. ? Drink alcohol. ? Take sleeping pills or medicines that cause drowsiness. ? Make important decisions or sign legal documents. ? Take care of children on your own.  Rest. Eating and drinking  Follow the diet recommended by your health care provider.  If you vomit: ? Drink water, juice, or soup when you can drink without vomiting. ? Make sure you have little or no nausea before eating solid foods. General instructions  Have a responsible adult stay with you until you are awake and alert.  Take over-the-counter and prescription medicines only as told by your health care provider.  If you smoke, do not smoke without supervision.  Keep all follow-up visits as told by your health care provider. This is important. Contact a health care provider if:  You keep feeling nauseous or you keep vomiting.  You feel light-headed.  You develop a rash.  You have a fever. Get help right away if:  You have trouble breathing. This information is not intended to replace advice given to you by your health care provider. Make sure you discuss any questions you have  with your health care provider. Document Revised: 12/28/2016 Document Reviewed: 05/07/2015 Elsevier Patient Education  2020 [employer] Femoral Site Care This sheet gives you information about how to care for yourself after your procedure. Your health care provider may also give you more specific instructions. If you have problems or questions, contact your health care provider. What can I expect after the procedure? After the procedure, it is common to have:  Bruising that usually fades within 1-2 weeks.  Tenderness at the site. Follow these instructions at home: Wound care  Follow instructions from your health care provider about how to take care of your insertion site. Make sure you: ? Wash your hands with soap and water before you change your bandage (dressing). If soap and water are not available, use hand sanitizer. ? REMOVE your dressing as told by your health care provider. In 24 hrs  Do not take baths, swim, or use a hot tub until your health care provider approves.  You may shower 24-48 hours after the procedure or as told by your health care provider. ? Gently wash the site with plain soap and water. ? Pat the area dry with a clean towel. ? Do not rub the site. This may cause bleeding.  Do not apply powder or lotion to the site. Keep the site clean and dry.  Check your femoral site every day for signs of infection. Check for: ? Redness, swelling, or pain. ? Fluid or blood. ? Warmth. ? Pus or a bad smell. Activity  For the first 2-3 days after your procedure, or  as long as directed: ? Avoid climbing stairs as much as possible. ? Do not squat.  Do not lift anything that is heavier than 10 lb (4.5 kg), or the limit that you are told, until your health care provider says that it is safe.  Rest as directed. ? Avoid sitting for a long time without moving. Get up to take short walks every 1-2 hours.  Do not drive for 24 hours if you were given a medicine to help you  relax (sedative). General instructions  Take over-the-counter and prescription medicines only as told by your health care provider.  Keep all follow-up visits as told by your health care provider. This is important. Contact a health care provider if you have:  A fever or chills.  You have redness, swelling, or pain around your insertion site. Get help right away if:  The catheter insertion area swells very fast.  You pass out.  You suddenly start to sweat or your skin gets clammy.  The catheter insertion area is bleeding, and the bleeding does not stop when you hold steady pressure on the area.  The area near or just beyond the catheter insertion site becomes pale, cool, tingly, or numb. These symptoms may represent a serious problem that is an emergency. Do not wait to see if the symptoms will go away. Get medical help right away. Call your local emergency services (911 in the [place] Do not drive yourself to the hospital. Summary  After the procedure, it is common to have bruising that usually fades within 1-2 weeks.  Check your femoral site every day for signs of infection.  Do not lift anything that is heavier than 10 lb (4.5 kg), or the limit that you are told, until your health care provider says that it is safe. This information is not intended to replace advice given to you by your health care provider. Make sure you discuss any questions you have with your health care provider. Document Revised: 01/28/2017 Document Reviewed: 01/28/2017 Elsevier Patient Education  2020 [employer].

## 2019-12-11 NOTE — Progress Notes (Signed)
Paged Dr. Edilia Bo for pain medication for patient.

## 2019-12-11 NOTE — Progress Notes (Signed)
Discussed discharge instructions with the patients wife. Discharge time given. Patient and wife understand.   Dr. Arbie Cookey is also now at the bedside for plan of care.

## 2019-12-11 NOTE — H&P (View-Only) (Signed)
Patient ID: [name], male   DOB: [birth year], 64 y.o.   MRN: [id number] Reviewed arteriogram from today.  Discussed with the patient.  Recommend right to left femoral to femoral bypass and left femoral to popliteal bypass.  It appears as though he could have a above-knee bypass.  Saphenous vein appears to be moderate size on the surface.  We will make decision at the time of surgery.  He is having severe rest pain but no change over the past several weeks.  He will be discharged today as planned and will be admitted for surgery on 12/15/2019.

## 2019-12-14 ENCOUNTER — Other Ambulatory Visit (HOSPITAL_COMMUNITY)
Admission: RE | Admit: 2019-12-14 | Discharge: 2019-12-14 | Disposition: A | Discharge status: Home / Self Care | Payer: 59 | Source: Ambulatory Visit | Attending: Vascular Surgery | Admitting: Vascular Surgery

## 2019-12-14 ENCOUNTER — Encounter (HOSPITAL_COMMUNITY): Payer: Self-pay | Admitting: Vascular Surgery

## 2019-12-14 DIAGNOSIS — Z20822 Contact with and (suspected) exposure to covid-19: Secondary | ICD-10-CM | POA: Insufficient documentation

## 2019-12-14 DIAGNOSIS — Z01812 Encounter for preprocedural laboratory examination: Secondary | ICD-10-CM | POA: Insufficient documentation

## 2019-12-14 LAB — SARS CORONAVIRUS 2 (TAT 6-24 HRS): SARS Coronavirus 2: NEGATIVE

## 2019-12-14 NOTE — Anesthesia Preprocedure Evaluation (Addendum)
Anesthesia Evaluation  Patient identified by MRN, date of birth, ID band Patient awake    Reviewed: Allergy & Precautions, NPO status , Patient's Chart, lab work & pertinent test results  History of Anesthesia Complications Negative for: history of anesthetic complications  Airway Mallampati: II  TM Distance: >3 FB Neck ROM: Full    Dental  (+) Dental Advisory Given, Edentulous Upper,    Pulmonary Current Smoker and Patient abstained from smoking., former smoker,    Pulmonary exam normal        Cardiovascular hypertension, Pt. on medications + Peripheral Vascular Disease  Normal cardiovascular exam     Neuro/Psych negative neurological ROS  negative psych ROS   GI/Hepatic negative GI ROS, Neg liver ROS,   Endo/Other  negative endocrine ROS  Renal/GU negative Renal ROS     Musculoskeletal  Gout    Abdominal   Peds  Hematology  (+) anemia ,   Anesthesia Other Findings Covid test negative  Reproductive/Obstetrics                          Anesthesia Physical Anesthesia Plan  ASA: III  Anesthesia Plan: General   Post-op Pain Management:    Induction: Intravenous  PONV Risk Score and Plan: 3 and Treatment may vary due to age or medical condition, Ondansetron, Midazolam and Dexamethasone  Airway Management Planned: Oral ETT  Additional Equipment: Arterial line  Intra-op Plan:   Post-operative Plan: Extubation in OR  Informed Consent: I have reviewed the patients History and Physical, chart, labs and discussed the procedure including the risks, benefits and alternatives for the proposed anesthesia with the patient or authorized representative who has indicated his/her understanding and acceptance.     Dental advisory given  Plan Discussed with: CRNA and Anesthesiologist  Anesthesia Plan Comments:        Anesthesia Quick Evaluation

## 2019-12-14 NOTE — Progress Notes (Signed)
PCP:  Glenetta Hew, MD Cardiologist:  Denies  EKG:  12/11/19 CXR:  05/10/04 ECHO:  Denies Stress Test:   Cardiac Cath:  Denies  Fasting Blood Sugar-  N/A Checks Blood Sugar_N/A__ times a day  OSA/CPAP:  No  ASA/Blood Thinners:  No  Covid test 12/14/19  Anesthesia Review:  No  Patient denies shortness of breath, fever, cough, and chest pain at PAT appointment.  Patient verbalized understanding of instructions provided today at the PAT appointment.  Patient asked to review instructions at home and day of surgery.

## 2019-12-15 ENCOUNTER — Inpatient Hospital Stay (HOSPITAL_COMMUNITY): Payer: 59 | Admitting: Certified Registered Nurse Anesthetist

## 2019-12-15 ENCOUNTER — Encounter (HOSPITAL_COMMUNITY)
Admission: RE | Disposition: A | Discharge status: Home / Self Care | Payer: Self-pay | Source: Home / Self Care | Attending: Vascular Surgery

## 2019-12-15 ENCOUNTER — Encounter (HOSPITAL_COMMUNITY): Payer: Self-pay | Admitting: Vascular Surgery

## 2019-12-15 ENCOUNTER — Inpatient Hospital Stay (HOSPITAL_COMMUNITY)
Admission: RE | Admit: 2019-12-15 | Discharge: 2019-12-17 | DRG: 254 | Disposition: A | Discharge status: Home / Self Care | Payer: 59 | Attending: Vascular Surgery | Admitting: Vascular Surgery

## 2019-12-15 ENCOUNTER — Other Ambulatory Visit: Payer: Self-pay

## 2019-12-15 DIAGNOSIS — I739 Peripheral vascular disease, unspecified: Secondary | ICD-10-CM | POA: Diagnosis not present

## 2019-12-15 DIAGNOSIS — M109 Gout, unspecified: Secondary | ICD-10-CM | POA: Diagnosis present

## 2019-12-15 DIAGNOSIS — Z9889 Other specified postprocedural states: Secondary | ICD-10-CM | POA: Diagnosis not present

## 2019-12-15 DIAGNOSIS — I70222 Atherosclerosis of native arteries of extremities with rest pain, left leg: Secondary | ICD-10-CM | POA: Diagnosis present

## 2019-12-15 DIAGNOSIS — Z23 Encounter for immunization: Secondary | ICD-10-CM

## 2019-12-15 DIAGNOSIS — Z20822 Contact with and (suspected) exposure to covid-19: Secondary | ICD-10-CM | POA: Diagnosis present

## 2019-12-15 DIAGNOSIS — I10 Essential (primary) hypertension: Secondary | ICD-10-CM | POA: Diagnosis present

## 2019-12-15 DIAGNOSIS — I998 Other disorder of circulatory system: Secondary | ICD-10-CM

## 2019-12-15 DIAGNOSIS — I70229 Atherosclerosis of native arteries of extremities with rest pain, unspecified extremity: Secondary | ICD-10-CM

## 2019-12-15 DIAGNOSIS — L97529 Non-pressure chronic ulcer of other part of left foot with unspecified severity: Secondary | ICD-10-CM | POA: Diagnosis present

## 2019-12-15 HISTORY — PX: FEMORAL-POPLITEAL BYPASS GRAFT: SHX937

## 2019-12-15 HISTORY — PX: FEMORAL-FEMORAL BYPASS GRAFT: SHX936

## 2019-12-15 HISTORY — PX: VEIN HARVEST: SHX6363

## 2019-12-15 LAB — COMPREHENSIVE METABOLIC PANEL
ALT: 29 U/L (ref 0–44)
AST: 19 U/L (ref 15–41)
Albumin: 3.8 g/dL (ref 3.5–5.0)
Alkaline Phosphatase: 57 U/L (ref 38–126)
Anion gap: 9 (ref 5–15)
BUN: 7 mg/dL — ABNORMAL LOW (ref 8–23)
CO2: 25 mmol/L (ref 22–32)
Calcium: 9.4 mg/dL (ref 8.9–10.3)
Chloride: 104 mmol/L (ref 98–111)
Creatinine, Ser: 0.93 mg/dL (ref 0.61–1.24)
GFR, Estimated: >60 mL/min (ref >60)
Glucose, Bld: 112 mg/dL — ABNORMAL HIGH (ref 70–99)
Potassium: 3.8 mmol/L (ref 3.5–5.1)
Sodium: 138 mmol/L (ref 135–145)
Total Bilirubin: 0.6 mg/dL (ref 0.3–1.2)
Total Protein: 6.5 g/dL (ref 6.5–8.1)

## 2019-12-15 LAB — TYPE AND SCREEN
ABO/RH(D): O POS
Antibody Screen: NEGATIVE

## 2019-12-15 LAB — CBC
HCT: 38.1 % — ABNORMAL LOW (ref 39.0–52.0)
Hemoglobin: 12.7 g/dL — ABNORMAL LOW (ref 13.0–17.0)
MCH: 31.4 pg (ref 26.0–34.0)
MCHC: 33.3 g/dL (ref 30.0–36.0)
MCV: 94.3 fL (ref 80.0–100.0)
Platelets: 336 10*3/uL (ref 150–400)
RBC: 4.04 MIL/uL — ABNORMAL LOW (ref 4.22–5.81)
RDW: 13.9 % (ref 11.5–15.5)
WBC: 7.4 10*3/uL (ref 4.0–10.5)
nRBC: 0 % (ref 0.0–0.2)

## 2019-12-15 LAB — PROTIME-INR
INR: 1 (ref 0.8–1.2)
Prothrombin Time: 12.8 seconds (ref 11.4–15.2)

## 2019-12-15 LAB — URINALYSIS, ROUTINE W REFLEX MICROSCOPIC
Bilirubin Urine: NEGATIVE
Glucose, UA: NEGATIVE mg/dL
Hgb urine dipstick: NEGATIVE
Ketones, ur: NEGATIVE mg/dL
Leukocytes,Ua: NEGATIVE
Nitrite: NEGATIVE
Protein, ur: NEGATIVE mg/dL
Specific Gravity, Urine: 1.032 — ABNORMAL HIGH (ref 1.005–1.030)
pH: 5 (ref 5.0–8.0)

## 2019-12-15 LAB — CREATININE, SERUM
Creatinine, Ser: 0.84 mg/dL (ref 0.61–1.24)
GFR, Estimated: >60 mL/min (ref >60)

## 2019-12-15 LAB — SURGICAL PCR SCREEN
MRSA, PCR: NEGATIVE
Staphylococcus aureus: NEGATIVE

## 2019-12-15 LAB — APTT: aPTT: 27 seconds (ref 24–36)

## 2019-12-15 LAB — ABO/RH: ABO/RH(D): O POS

## 2019-12-15 SURGERY — CREATION, BYPASS, ARTERIAL, FEMORAL TO FEMORAL, USING GRAFT
Anesthesia: General | Laterality: Left

## 2019-12-15 MED ORDER — ALBUMIN HUMAN 5 % IV SOLN: INTRAVENOUS | Status: DC | PRN

## 2019-12-15 MED ORDER — ONDANSETRON HCL 4 MG/2ML IJ SOLN: 4.0000 mg | Freq: Four times a day (QID) | INTRAMUSCULAR | Status: DC | PRN

## 2019-12-15 MED ORDER — SODIUM CHLORIDE 0.9 % IV SOLN: INTRAVENOUS | Status: DC

## 2019-12-15 MED ORDER — MIDAZOLAM HCL 5 MG/5ML IJ SOLN
INTRAMUSCULAR | Status: DC | PRN
  Administered 2019-12-15: 2 mg via INTRAVENOUS

## 2019-12-15 MED ORDER — POTASSIUM CHLORIDE CRYS ER 20 MEQ PO TBCR: 20.0000 meq | EXTENDED_RELEASE_TABLET | Freq: Every day | ORAL | Status: DC | PRN

## 2019-12-15 MED ORDER — MAGNESIUM SULFATE 2 GM/50ML IV SOLN: 2.0000 g | Freq: Every day | INTRAVENOUS | Status: DC | PRN

## 2019-12-15 MED ORDER — LISINOPRIL 10 MG PO TABS
10.0000 mg | ORAL_TABLET | Freq: Every day | ORAL | Status: DC
  Administered 2019-12-17: 10 mg via ORAL
  Filled 2019-12-15: qty 1

## 2019-12-15 MED ORDER — CHLORHEXIDINE GLUCONATE CLOTH 2 % EX PADS: 6.0000 | MEDICATED_PAD | Freq: Every day | CUTANEOUS | Status: DC

## 2019-12-15 MED ORDER — DEXAMETHASONE SODIUM PHOSPHATE 10 MG/ML IJ SOLN
INTRAMUSCULAR | Status: DC | PRN
  Administered 2019-12-15: 10 mg via INTRAVENOUS

## 2019-12-15 MED ORDER — FENTANYL CITRATE (PF) 250 MCG/5ML IJ SOLN
INTRAMUSCULAR | Status: AC
  Filled 2019-12-15: qty 5

## 2019-12-15 MED ORDER — GUAIFENESIN-DM 100-10 MG/5ML PO SYRP: 15.0000 mL | ORAL_SOLUTION | ORAL | Status: DC | PRN

## 2019-12-15 MED ORDER — PANTOPRAZOLE SODIUM 40 MG PO TBEC
40.0000 mg | DELAYED_RELEASE_TABLET | Freq: Every day | ORAL | Status: DC
  Administered 2019-12-15 – 2019-12-17 (×3): 40 mg via ORAL
  Filled 2019-12-15 (×3): qty 1

## 2019-12-15 MED ORDER — LACTATED RINGERS IV SOLN: INTRAVENOUS | Status: DC | PRN

## 2019-12-15 MED ORDER — ACETAMINOPHEN 650 MG RE SUPP: 325.0000 mg | RECTAL | Status: DC | PRN

## 2019-12-15 MED ORDER — PHENYLEPHRINE 40 MCG/ML (10ML) SYRINGE FOR IV PUSH (FOR BLOOD PRESSURE SUPPORT)
PREFILLED_SYRINGE | INTRAVENOUS | Status: DC | PRN
  Administered 2019-12-15: 40 ug via INTRAVENOUS
  Administered 2019-12-15 (×2): 80 ug via INTRAVENOUS
  Administered 2019-12-15 (×3): 120 ug via INTRAVENOUS
  Administered 2019-12-15: 80 ug via INTRAVENOUS

## 2019-12-15 MED ORDER — MIDAZOLAM HCL 2 MG/2ML IJ SOLN
INTRAMUSCULAR | Status: AC
  Filled 2019-12-15: qty 2

## 2019-12-15 MED ORDER — PHENYLEPHRINE 40 MCG/ML (10ML) SYRINGE FOR IV PUSH (FOR BLOOD PRESSURE SUPPORT)
PREFILLED_SYRINGE | INTRAVENOUS | Status: AC
  Filled 2019-12-15: qty 10

## 2019-12-15 MED ORDER — LIDOCAINE 2% (20 MG/ML) 5 ML SYRINGE
INTRAMUSCULAR | Status: AC
  Filled 2019-12-15: qty 5

## 2019-12-15 MED ORDER — EPHEDRINE SULFATE-NACL 50-0.9 MG/10ML-% IV SOSY
PREFILLED_SYRINGE | INTRAVENOUS | Status: DC | PRN
  Administered 2019-12-15: 5 mg via INTRAVENOUS

## 2019-12-15 MED ORDER — ORAL CARE MOUTH RINSE: 15.0000 mL | Freq: Once | OROMUCOSAL | Status: AC

## 2019-12-15 MED ORDER — PROPOFOL 10 MG/ML IV BOLUS
INTRAVENOUS | Status: DC | PRN
  Administered 2019-12-15: 150 mg via INTRAVENOUS

## 2019-12-15 MED ORDER — DEXAMETHASONE SODIUM PHOSPHATE 10 MG/ML IJ SOLN
INTRAMUSCULAR | Status: AC
  Filled 2019-12-15: qty 1

## 2019-12-15 MED ORDER — LACTATED RINGERS IV SOLN: INTRAVENOUS | Status: DC

## 2019-12-15 MED ORDER — ATORVASTATIN CALCIUM 10 MG PO TABS
10.0000 mg | ORAL_TABLET | Freq: Every day | ORAL | Status: DC
  Administered 2019-12-15 – 2019-12-17 (×3): 10 mg via ORAL
  Filled 2019-12-15 (×3): qty 1

## 2019-12-15 MED ORDER — CHLORHEXIDINE GLUCONATE 0.12 % MT SOLN
OROMUCOSAL | Status: AC
  Administered 2019-12-15: 15 mL via OROMUCOSAL
  Filled 2019-12-15: qty 15

## 2019-12-15 MED ORDER — OXYCODONE-ACETAMINOPHEN 5-325 MG PO TABS
1.0000 | ORAL_TABLET | ORAL | Status: DC | PRN
  Administered 2019-12-15 – 2019-12-17 (×10): 2 via ORAL
  Filled 2019-12-15 (×10): qty 2

## 2019-12-15 MED ORDER — OXYCODONE HCL 5 MG/5ML PO SOLN: 5.0000 mg | Freq: Once | ORAL | Status: DC | PRN

## 2019-12-15 MED ORDER — CHLORHEXIDINE GLUCONATE 0.12 % MT SOLN: 15.0000 mL | Freq: Once | OROMUCOSAL | Status: AC

## 2019-12-15 MED ORDER — CEFAZOLIN SODIUM-DEXTROSE 2-4 GM/100ML-% IV SOLN
2.0000 g | INTRAVENOUS | Status: AC
  Administered 2019-12-15: 2 g via INTRAVENOUS
  Filled 2019-12-15: qty 100

## 2019-12-15 MED ORDER — SUGAMMADEX SODIUM 200 MG/2ML IV SOLN
INTRAVENOUS | Status: DC | PRN
  Administered 2019-12-15: 200 mg via INTRAVENOUS

## 2019-12-15 MED ORDER — HEPARIN SODIUM (PORCINE) 5000 UNIT/ML IJ SOLN
5000.0000 [IU] | Freq: Three times a day (TID) | INTRAMUSCULAR | Status: DC
  Administered 2019-12-16 – 2019-12-17 (×4): 5000 [IU] via SUBCUTANEOUS
  Filled 2019-12-15 (×4): qty 1

## 2019-12-15 MED ORDER — ROCURONIUM BROMIDE 10 MG/ML (PF) SYRINGE
PREFILLED_SYRINGE | INTRAVENOUS | Status: AC
  Filled 2019-12-15: qty 10

## 2019-12-15 MED ORDER — SODIUM CHLORIDE 0.9 % IV SOLN: INTRAVENOUS | Status: AC

## 2019-12-15 MED ORDER — HEPARIN SODIUM (PORCINE) 1000 UNIT/ML IJ SOLN
INTRAMUSCULAR | Status: AC
  Filled 2019-12-15: qty 1

## 2019-12-15 MED ORDER — PROPOFOL 10 MG/ML IV BOLUS
INTRAVENOUS | Status: AC
  Filled 2019-12-15: qty 20

## 2019-12-15 MED ORDER — DOCUSATE SODIUM 100 MG PO CAPS
100.0000 mg | ORAL_CAPSULE | Freq: Every day | ORAL | Status: DC
  Administered 2019-12-16 – 2019-12-17 (×2): 100 mg via ORAL
  Filled 2019-12-15 (×2): qty 1

## 2019-12-15 MED ORDER — PHENYLEPHRINE HCL-NACL 10-0.9 MG/250ML-% IV SOLN
INTRAVENOUS | Status: DC | PRN
  Administered 2019-12-15: 25 ug/min via INTRAVENOUS

## 2019-12-15 MED ORDER — SODIUM CHLORIDE 0.9 % IV SOLN
INTRAVENOUS | Status: AC
  Filled 2019-12-15: qty 1.2

## 2019-12-15 MED ORDER — ONDANSETRON HCL 4 MG/2ML IJ SOLN
INTRAMUSCULAR | Status: DC | PRN
  Administered 2019-12-15: 4 mg via INTRAVENOUS

## 2019-12-15 MED ORDER — 0.9 % SODIUM CHLORIDE (POUR BTL) OPTIME
TOPICAL | Status: DC | PRN
  Administered 2019-12-15: 2000 mL

## 2019-12-15 MED ORDER — HYDRALAZINE HCL 20 MG/ML IJ SOLN: 5.0000 mg | INTRAMUSCULAR | Status: DC | PRN

## 2019-12-15 MED ORDER — PROTAMINE SULFATE 10 MG/ML IV SOLN
INTRAVENOUS | Status: DC | PRN
  Administered 2019-12-15: 10 mg via INTRAVENOUS
  Administered 2019-12-15 (×2): 20 mg via INTRAVENOUS

## 2019-12-15 MED ORDER — ACETAMINOPHEN 325 MG PO TABS: 325.0000 mg | ORAL_TABLET | ORAL | Status: DC | PRN

## 2019-12-15 MED ORDER — METOPROLOL TARTRATE 5 MG/5ML IV SOLN: 2.0000 mg | INTRAVENOUS | Status: DC | PRN

## 2019-12-15 MED ORDER — CHLORHEXIDINE GLUCONATE CLOTH 2 % EX PADS: 6.0000 | MEDICATED_PAD | Freq: Once | CUTANEOUS | Status: DC

## 2019-12-15 MED ORDER — SODIUM CHLORIDE 0.9 % IV SOLN: 500.0000 mL | Freq: Once | INTRAVENOUS | Status: DC | PRN

## 2019-12-15 MED ORDER — HEPARIN SODIUM (PORCINE) 1000 UNIT/ML IJ SOLN
INTRAMUSCULAR | Status: DC | PRN
  Administered 2019-12-15: 8000 [IU] via INTRAVENOUS

## 2019-12-15 MED ORDER — ROCURONIUM BROMIDE 10 MG/ML (PF) SYRINGE
PREFILLED_SYRINGE | INTRAVENOUS | Status: DC | PRN
  Administered 2019-12-15 (×3): 20 mg via INTRAVENOUS
  Administered 2019-12-15: 30 mg via INTRAVENOUS
  Administered 2019-12-15: 20 mg via INTRAVENOUS
  Administered 2019-12-15: 50 mg via INTRAVENOUS

## 2019-12-15 MED ORDER — FENTANYL CITRATE (PF) 100 MCG/2ML IJ SOLN
25.0000 ug | INTRAMUSCULAR | Status: DC | PRN
  Administered 2019-12-15: 50 ug via INTRAVENOUS

## 2019-12-15 MED ORDER — EPHEDRINE 5 MG/ML INJ
INTRAVENOUS | Status: AC
  Filled 2019-12-15: qty 10

## 2019-12-15 MED ORDER — FENTANYL CITRATE (PF) 100 MCG/2ML IJ SOLN
INTRAMUSCULAR | Status: AC
  Administered 2019-12-15: 50 ug via INTRAVENOUS
  Filled 2019-12-15: qty 2

## 2019-12-15 MED ORDER — SODIUM CHLORIDE 0.9 % IV SOLN: INTRAVENOUS | Status: DC | PRN

## 2019-12-15 MED ORDER — FENTANYL CITRATE (PF) 250 MCG/5ML IJ SOLN
INTRAMUSCULAR | Status: DC | PRN
  Administered 2019-12-15: 50 ug via INTRAVENOUS
  Administered 2019-12-15: 100 ug via INTRAVENOUS
  Administered 2019-12-15 (×7): 50 ug via INTRAVENOUS

## 2019-12-15 MED ORDER — PROMETHAZINE HCL 25 MG/ML IJ SOLN: 6.2500 mg | INTRAMUSCULAR | Status: DC | PRN

## 2019-12-15 MED ORDER — GABAPENTIN 100 MG PO CAPS
100.0000 mg | ORAL_CAPSULE | Freq: Three times a day (TID) | ORAL | Status: DC | PRN
  Administered 2019-12-15 – 2019-12-16 (×4): 100 mg via ORAL
  Filled 2019-12-15 (×4): qty 1

## 2019-12-15 MED ORDER — LABETALOL HCL 5 MG/ML IV SOLN
10.0000 mg | INTRAVENOUS | Status: DC | PRN
  Filled 2019-12-15: qty 4

## 2019-12-15 MED ORDER — LIDOCAINE 2% (20 MG/ML) 5 ML SYRINGE
INTRAMUSCULAR | Status: DC | PRN
  Administered 2019-12-15: 60 mg via INTRAVENOUS

## 2019-12-15 MED ORDER — OXYCODONE HCL 5 MG PO TABS: 5.0000 mg | ORAL_TABLET | Freq: Once | ORAL | Status: DC | PRN

## 2019-12-15 MED ORDER — ALUM & MAG HYDROXIDE-SIMETH 200-200-20 MG/5ML PO SUSP: 15.0000 mL | ORAL | Status: DC | PRN

## 2019-12-15 MED ORDER — MORPHINE SULFATE (PF) 2 MG/ML IV SOLN
2.0000 mg | INTRAVENOUS | Status: DC | PRN
  Administered 2019-12-15 – 2019-12-17 (×6): 2 mg via INTRAVENOUS
  Filled 2019-12-15 (×6): qty 1

## 2019-12-15 MED ORDER — PHENOL 1.4 % MT LIQD: 1.0000 | OROMUCOSAL | Status: DC | PRN

## 2019-12-15 MED ORDER — ONDANSETRON HCL 4 MG/2ML IJ SOLN
INTRAMUSCULAR | Status: AC
  Filled 2019-12-15: qty 2

## 2019-12-15 MED ORDER — CEFAZOLIN SODIUM-DEXTROSE 2-4 GM/100ML-% IV SOLN
2.0000 g | Freq: Three times a day (TID) | INTRAVENOUS | Status: AC
  Administered 2019-12-15 (×2): 2 g via INTRAVENOUS
  Filled 2019-12-15 (×2): qty 100

## 2019-12-15 SURGICAL SUPPLY — 63 items
BANDAGE ESMARK 6X9 LF (GAUZE/BANDAGES/DRESSINGS) IMPLANT
BNDG CMPR 9X6 STRL LF SNTH (GAUZE/BANDAGES/DRESSINGS)
BNDG ESMARK 6X9 LF (GAUZE/BANDAGES/DRESSINGS)
CANISTER SUCT 3000ML PPV (MISCELLANEOUS) ×4 IMPLANT
CANNULA VESSEL 3MM 2 BLNT TIP (CANNULA) ×12 IMPLANT
CLIP LIGATING EXTRA MED SLVR (CLIP) ×4 IMPLANT
CLIP LIGATING EXTRA SM BLUE (MISCELLANEOUS) ×4 IMPLANT
COVER MAYO STAND STRL (DRAPES) ×4 IMPLANT
CUFF TOURN SGL QUICK 34 (TOURNIQUET CUFF)
CUFF TOURN SGL QUICK 42 (TOURNIQUET CUFF) IMPLANT
CUFF TRNQT CYL 34X4.125X (TOURNIQUET CUFF) IMPLANT
DERMABOND ADVANCED (GAUZE/BANDAGES/DRESSINGS) ×12
DERMABOND ADVANCED .7 DNX12 (GAUZE/BANDAGES/DRESSINGS) ×12 IMPLANT
DRAIN SNY 10X20 3/4 PERF (WOUND CARE) IMPLANT
DRAPE HALF SHEET 40X57 (DRAPES) IMPLANT
DRAPE X-RAY CASS 24X20 (DRAPES) IMPLANT
ELECT REM PT RETURN 9FT ADLT (ELECTROSURGICAL) ×4
ELECTRODE REM PT RTRN 9FT ADLT (ELECTROSURGICAL) ×2 IMPLANT
EVACUATOR SILICONE 100CC (DRAIN) IMPLANT
GLOVE BIO SURGEON STRL SZ 6.5 (GLOVE) ×3 IMPLANT
GLOVE BIO SURGEON STRL SZ7 (GLOVE) ×4 IMPLANT
GLOVE BIO SURGEON STRL SZ7.5 (GLOVE) ×8 IMPLANT
GLOVE BIO SURGEONS STRL SZ 6.5 (GLOVE) ×1
GLOVE BIOGEL PI IND STRL 7.0 (GLOVE) ×4 IMPLANT
GLOVE BIOGEL PI IND STRL 8 (GLOVE) ×2 IMPLANT
GLOVE BIOGEL PI INDICATOR 7.0 (GLOVE) ×4
GLOVE BIOGEL PI INDICATOR 8 (GLOVE) ×2
GLOVE ECLIPSE 6.5 STRL STRAW (GLOVE) ×4 IMPLANT
GLOVE SS BIOGEL STRL SZ 7.5 (GLOVE) ×2 IMPLANT
GLOVE SUPERSENSE BIOGEL SZ 7.5 (GLOVE) ×2
GLOVE SURG SS PI 7.0 STRL IVOR (GLOVE) ×4 IMPLANT
GOWN STRL REUS W/ TWL LRG LVL3 (GOWN DISPOSABLE) ×12 IMPLANT
GOWN STRL REUS W/TWL LRG LVL3 (GOWN DISPOSABLE) ×24
GRAFT HEMASHIELD 8MM (Vascular Products) ×4 IMPLANT
GRAFT VASC STRG 30X8KNIT (Vascular Products) ×2 IMPLANT
INSERT FOGARTY SM (MISCELLANEOUS) ×4 IMPLANT
KIT BASIN OR (CUSTOM PROCEDURE TRAY) ×4 IMPLANT
KIT TURNOVER KIT B (KITS) ×4 IMPLANT
LOOP VESSEL MAXI BLUE (MISCELLANEOUS) ×8 IMPLANT
LOOP VESSEL MINI RED (MISCELLANEOUS) ×4 IMPLANT
NS IRRIG 1000ML POUR BTL (IV SOLUTION) ×8 IMPLANT
PACK PERIPHERAL VASCULAR (CUSTOM PROCEDURE TRAY) ×4 IMPLANT
PAD ARMBOARD 7.5X6 YLW CONV (MISCELLANEOUS) ×8 IMPLANT
PADDING CAST COTTON 6X4 STRL (CAST SUPPLIES) IMPLANT
SET COLLECT BLD 21X3/4 12 (NEEDLE) IMPLANT
SPONGE LAP 18X18 RF (DISPOSABLE) ×8 IMPLANT
STAPLER VISISTAT 35W (STAPLE) IMPLANT
STOPCOCK 4 WAY LG BORE MALE ST (IV SETS) IMPLANT
SUT ETHILON 3 0 PS 1 (SUTURE) IMPLANT
SUT PROLENE 5 0 C 1 24 (SUTURE) ×8 IMPLANT
SUT PROLENE 6 0 CC (SUTURE) ×32 IMPLANT
SUT SILK 2 0 SH (SUTURE) ×4 IMPLANT
SUT SILK 3 0 (SUTURE) ×8
SUT SILK 3-0 18XBRD TIE 12 (SUTURE) ×4 IMPLANT
SUT VIC AB 2-0 CTX 36 (SUTURE) ×16 IMPLANT
SUT VIC AB 3-0 SH 27 (SUTURE) ×20
SUT VIC AB 3-0 SH 27X BRD (SUTURE) ×10 IMPLANT
SUT VIC AB 4-0 PS2 18 (SUTURE) ×4 IMPLANT
TOWEL GREEN STERILE (TOWEL DISPOSABLE) ×8 IMPLANT
TRAY FOLEY MTR SLVR 16FR STAT (SET/KITS/TRAYS/PACK) ×4 IMPLANT
TUBING EXTENTION W/L.L. (IV SETS) IMPLANT
UNDERPAD 30X36 HEAVY ABSORB (UNDERPADS AND DIAPERS) ×4 IMPLANT
WATER STERILE IRR 1000ML POUR (IV SOLUTION) ×4 IMPLANT

## 2019-12-15 NOTE — Op Note (Signed)
OPERATIVE REPORT  DATE OF SURGERY: 12/15/2019  PATIENT: [name], 64 y.o. male MRN: [id number]  DOB: 1955/11/07  PRE-OPERATIVE DIAGNOSIS: Critical limb ischemia left leg  POST-OPERATIVE DIAGNOSIS:  Same  PROCEDURE: #1 right to left femorofemoral bypass with 8 mm Hemashield graft, #2 left femoral to above-knee popliteal bypass with translocated nonreversed great saphenous vein  SURGEON:  Gretta Began, M.D.  PHYSICIAN ASSISTANT: Dr. Cari Caraway, Dr. Lemar Livings, Aggie Moats, PA-C  The assistant was needed for exposure and to expedite the case  ANESTHESIA: General  EBL: per anesthesia record  Total I/O In: 2350 [I.V.:2000; IV Piggyback:350] Out: 700 [Urine:400; Blood:300]  BLOOD ADMINISTERED: none  DRAINS: none  SPECIMEN: none  COUNTS CORRECT:  YES  PATIENT DISPOSITION:  PACU - hemodynamically stable  PROCEDURE DETAILS: Patient was taken operating placed to position where the area of the both groins and left leg prepped draped you sterile fashion.  Incision was made over the right and left femoral arteries and carried down through the subcutaneous tissue.  The common superficial femoral and profundus femoris arteries were isolated bilaterally.  Next the saphenous vein was identified at the saphenofemoral junction.  SonoSite ultrasound was used to identify the location of the saphenous vein.  The vein was of moderate size.  Skin bridges were used and the saphenous vein was unroofed from the groin to the knee.  The vein branch at the knee and was unusable below this.  It was of moderate size proximally.  The vein was ligated distally and was divided and was cannulated with a vessel canyon was gently dilated was felt to be adequate size for bypass.  The vein was occluded at the saphenofemoral junction with a Cooley clamp and the vein was excised from the saphenofemoral junction.  The vein was then oversewn with a 5-0 Prolene suture.  A tunnel was created from the right to  left groin and also from the above-knee popliteal artery to the left groin.  The above-knee popliteal artery was of good caliber with minimal atherosclerotic change.  Patient was given 8000 intravenous heparin.  After adequate circulation time the right common superficial femoral and fundus femoris arteries were occluded.  The common femoral artery was opened with 11 blade sent ulcerating with Potts scissors.  A 8 Hemashield graft was brought through the femorofemoral tunnel.  The graft was spatulated and sewn end-to-side to the right common femoral artery with a running 6-0 Prolene suture.  The anastomosis was tested and found to be adequate.  The graft was occluded with a Fogarty hydrogrip clamp and the clamps were removed from the femoral arteries on the right.  Next the common superficial femoral profunda femoris arteries were occluded on the left.  The common femoral artery was opened with 11 blade simultaneous Potts scissors.  The graft was spatulated and sewn end-to-side to the common femoral artery on the left with a running 6-0 Prolene suture.  This anastomosis was tested and found to be adequate as well.  Finally the saphenous vein was brought onto the field and the ellipse of graft was removed from the hood of the left limb of the right to left femorofemoral bypass near the femoral anastomosis.  The vein was nonreversed.  The proximal portion of the vein was spatulated and sewn end-to-side to the hood of the Dacron graft with a running 6-0 Prolene suture.  This anastomosis was tested and found to be adequate.  Next mils valvulotome was used to lyse the vein valves  giving good flow through the vein graft.  The vein graft was brought through the prior created tunnel to the above-knee popliteal artery.  Above-knee popliteal artery was occluded proximally distally and was opened with 11 blade and sent longitudinally with Potts scissors.  The vein was cut to the appropriate length and was spatulated and sewn  end-to-side to the artery with a running 6-0 Prolene suture.  Clamps were removed and excellent reactivated Doppler flow was noted in the posterior tibial at the ankle.  The patient was given 50 mg of protamine to reverse the heparin.  The wounds irrigated with saline.  Hemostasis to electrocautery.  The wounds were closed with 3-0 Vicryl in the subcutaneous and subcuticular tissue.  Sterile dressing was applied and the patient was transferred to the recovery room in stable condition   Larina Earthly, M.D., [facility] 12/15/2019 12:19 PM

## 2019-12-15 NOTE — Interval H&P Note (Signed)
History and Physical Interval Note:  12/15/2019 7:13 AM  [name]  has presented today for surgery, with the diagnosis of CRITICAL LIMB ISCHEMIA LEFT LEG.  The various methods of treatment have been discussed with the patient and family. After consideration of risks, benefits and other options for treatment, the patient has consented to  Procedure(s): RIGHT TO LEFT FEMORAL-FEMORAL ARTERY BYPASS GRAFT (Bilateral) LEFT FEMORAL-POPLITEAL ARTERY BYPASS GRAFT (Left) as a surgical intervention.  The patient's history has been reviewed, patient examined, no change in status, stable for surgery.  I have reviewed the patient's chart and labs.  Questions were answered to the patient's satisfaction.     Gretta Began

## 2019-12-15 NOTE — Transfer of Care (Signed)
Immediate Anesthesia Transfer of Care Note  Patient: [name]  Procedure(s) Performed: RIGHT TO LEFT FEMORAL-FEMORAL ARTERY BYPASS USING 64mm X 30cm HEMASHIELD GOLD GRAFT (Bilateral ) LEFT FEMORAL-POPLITEAL ARTERY BYPASS GRAFT (Left ) VEIN HARVEST (Left )  Patient Location: PACU  Anesthesia Type:General  Level of Consciousness: awake, alert  and oriented  Airway & Oxygen Therapy: Patient Spontanous Breathing  Post-op Assessment: Report given to RN, Post -op Vital signs reviewed and stable and Patient moving all extremities X 4  Post vital signs: Reviewed and stable  Last Vitals:  Vitals Value Taken Time  BP 139/94 12/15/19 1210  Temp    Pulse 109 12/15/19 1213  Resp 15 12/15/19 1213  SpO2 99 % 12/15/19 1213  Vitals shown include unvalidated device data.  Last Pain:  Vitals:   12/15/19 0644  TempSrc: Oral  PainSc:          Complications: No complications documented.

## 2019-12-15 NOTE — Anesthesia Procedure Notes (Signed)
Procedure Name: Intubation Date/Time: 12/15/2019 7:45 AM Performed by: Nils Pyle, CRNA Pre-anesthesia Checklist: Patient identified, Emergency Drugs available, Suction available and Patient being monitored Patient Re-evaluated:Patient Re-evaluated prior to induction Oxygen Delivery Method: Circle System Utilized Preoxygenation: Pre-oxygenation with 100% oxygen Induction Type: IV induction Ventilation: Mask ventilation without difficulty and Oral airway inserted - appropriate to patient size Laryngoscope Size: Hyacinth Meeker and 2 Grade View: Grade I Tube type: Oral Tube size: 7.5 mm Number of attempts: 1 Airway Equipment and Method: Stylet and Oral airway Placement Confirmation: ETT inserted through vocal cords under direct vision,  positive ETCO2 and breath sounds checked- equal and bilateral Secured at: 23 cm Tube secured with: Tape Dental Injury: Teeth and Oropharynx as per pre-operative assessment

## 2019-12-15 NOTE — Anesthesia Postprocedure Evaluation (Signed)
Anesthesia Post Note  Patient: [name]  Procedure(s) Performed: RIGHT TO LEFT FEMORAL-FEMORAL ARTERY BYPASS USING 52mm X 30cm HEMASHIELD GOLD GRAFT (Bilateral ) LEFT FEMORAL-POPLITEAL ARTERY BYPASS GRAFT (Left ) VEIN HARVEST (Left )     Patient location during evaluation: PACU Anesthesia Type: General Level of consciousness: awake and alert Pain management: pain level controlled Vital Signs Assessment: post-procedure vital signs reviewed and stable Respiratory status: spontaneous breathing, nonlabored ventilation and respiratory function stable Cardiovascular status: blood pressure returned to baseline and stable Postop Assessment: no apparent nausea or vomiting Anesthetic complications: no   No complications documented.  Last Vitals:  Vitals:   12/15/19 1255 12/15/19 1312  BP: (!) 149/88   Pulse: 94   Resp: 14   Temp: 36.7 C 36.4 C  SpO2: 98%     Last Pain:  Vitals:   12/15/19 1312  TempSrc: Oral  PainSc: 0-No pain                 Beryle Lathe

## 2019-12-15 NOTE — Progress Notes (Signed)
  Progress Note    12/15/2019 2:50 PM Day of Surgery  Subjective:  Discomfort around incisions   Vitals:   12/15/19 1255 12/15/19 1312  BP: (!) 149/88   Pulse: 94   Resp: 14   Temp: 98 F (36.7 C) 97.6 F (36.4 C)  SpO2: 98%    Physical Exam: Lungs:  Non labored Incisions:  Groin incisions c/d/i; LLe incisions c/d/i Extremities:  Palpable fem fem pulse; R At and PT by doppler; L PT and DP by doppler Neurologic: A&O  CBC    Component Value Date/Time   WBC 7.4 12/15/2019 1300   RBC 4.04 (L) 12/15/2019 1300   HGB 12.7 (L) 12/15/2019 1300   HCT 38.1 (L) 12/15/2019 1300   PLT 336 12/15/2019 1300   MCV 94.3 12/15/2019 1300   MCH 31.4 12/15/2019 1300   MCHC 33.3 12/15/2019 1300   RDW 13.9 12/15/2019 1300   LYMPHSABS 2.6 12/09/2019 0252   MONOABS 0.4 12/09/2019 0252   EOSABS 0.3 12/09/2019 0252   BASOSABS 0.1 12/09/2019 0252    BMET    Component Value Date/Time   NA 138 12/15/2019 0708   K 3.8 12/15/2019 0708   CL 104 12/15/2019 0708   CO2 25 12/15/2019 0708   GLUCOSE 112 (H) 12/15/2019 0708   BUN 7 (L) 12/15/2019 0708   CREATININE 0.84 12/15/2019 1300   CALCIUM 9.4 12/15/2019 0708   GFRNONAA >60 12/15/2019 1300    INR    Component Value Date/Time   INR 1.0 12/15/2019 0708     Intake/Output Summary (Last 24 hours) at 12/15/2019 1450 Last data filed at 12/15/2019 1220 Gross per 24 hour  Intake 2350 ml  Output 850 ml  Net 1500 ml     Assessment/Plan:  64 y.o. male is s/p R to L fem fem and L fem AK pop with vein Day of Surgery   BLE well perfused Incisions are unremarkable Ok to advance diet PT/OT to work with patient tomorrow   Emilie Rutter, PA-C [facility] [phone] 12/15/2019 2:50 PM

## 2019-12-15 NOTE — Anesthesia Procedure Notes (Signed)
Arterial Line Insertion Start/End11/16/2021 7:00 AM, 12/15/2019 7:05 AM Performed by: Tressia Miners, CRNA, CRNA  Patient location: Pre-op. Preanesthetic checklist: patient identified, IV checked, site marked, risks and benefits discussed, surgical consent, monitors and equipment checked, pre-op evaluation and anesthesia consent Lidocaine 1% used for infiltration Left, radial was placed Catheter size: 20 G Hand hygiene performed  and maximum sterile barriers used   Attempts: 1 Procedure performed without using ultrasound guided technique. Ultrasound Notes:anatomy identified, needle tip was noted to be adjacent to the nerve/plexus identified and no ultrasound evidence of intravascular and/or intraneural injection Following insertion, dressing applied and Biopatch. Post procedure assessment: normal  Patient tolerated the procedure well with no immediate complications.

## 2019-12-15 NOTE — Progress Notes (Signed)
Pt arrived from unit from PACU. BP 150/98 HR 92 SpO2 98 RA RR12 . CHG and Skin assessment completed. CCMD notified  Unable to doppler left dorsalis pedis pulse will notify MD. Will continue to monitor. Pt. Oriented to unit   Everlean Cherry, RN

## 2019-12-15 NOTE — OR Nursing (Signed)
Patient arrived to OR 12 with his phone in the stretcher. His iphone was turned off and placed into a bag at the circulator's desk and placed into the patient's chart with a note on it to take to PACU and turn it on for the patient. Iphone has some white paint on the black phone case and 4 small cracks on the screen bottom right.

## 2019-12-16 ENCOUNTER — Inpatient Hospital Stay (HOSPITAL_COMMUNITY): Payer: 59

## 2019-12-16 ENCOUNTER — Encounter (HOSPITAL_COMMUNITY): Payer: Self-pay | Admitting: Vascular Surgery

## 2019-12-16 DIAGNOSIS — Z9889 Other specified postprocedural states: Secondary | ICD-10-CM | POA: Diagnosis not present

## 2019-12-16 DIAGNOSIS — I739 Peripheral vascular disease, unspecified: Secondary | ICD-10-CM

## 2019-12-16 LAB — LIPID PANEL
Cholesterol: 89 mg/dL (ref 0–200)
HDL: 32 mg/dL — ABNORMAL LOW (ref >40)
LDL Cholesterol: 41 mg/dL (ref 0–99)
Total CHOL/HDL Ratio: 2.8 RATIO
Triglycerides: 81 mg/dL (ref <150)
VLDL: 16 mg/dL (ref 0–40)

## 2019-12-16 LAB — BASIC METABOLIC PANEL
Anion gap: 7 (ref 5–15)
BUN: 7 mg/dL — ABNORMAL LOW (ref 8–23)
CO2: 23 mmol/L (ref 22–32)
Calcium: 8.9 mg/dL (ref 8.9–10.3)
Chloride: 104 mmol/L (ref 98–111)
Creatinine, Ser: 0.84 mg/dL (ref 0.61–1.24)
GFR, Estimated: >60 mL/min (ref >60)
Glucose, Bld: 123 mg/dL — ABNORMAL HIGH (ref 70–99)
Potassium: 3.7 mmol/L (ref 3.5–5.1)
Sodium: 134 mmol/L — ABNORMAL LOW (ref 135–145)

## 2019-12-16 LAB — CBC
HCT: 33.9 % — ABNORMAL LOW (ref 39.0–52.0)
Hemoglobin: 11.5 g/dL — ABNORMAL LOW (ref 13.0–17.0)
MCH: 31.9 pg (ref 26.0–34.0)
MCHC: 33.9 g/dL (ref 30.0–36.0)
MCV: 93.9 fL (ref 80.0–100.0)
Platelets: 321 10*3/uL (ref 150–400)
RBC: 3.61 MIL/uL — ABNORMAL LOW (ref 4.22–5.81)
RDW: 14.2 % (ref 11.5–15.5)
WBC: 11.6 10*3/uL — ABNORMAL HIGH (ref 4.0–10.5)
nRBC: 0 % (ref 0.0–0.2)

## 2019-12-16 MED ORDER — INFLUENZA VAC SPLIT QUAD 0.5 ML IM SUSY
0.5000 mL | PREFILLED_SYRINGE | INTRAMUSCULAR | Status: AC
  Administered 2019-12-17: 0.5 mL via INTRAMUSCULAR

## 2019-12-16 NOTE — Evaluation (Signed)
Physical Therapy Evaluation Patient Details Name: [name] MRN: [id number] DOB: 11-Apr-1955 Today's Date: 12/16/2019   History of Present Illness  64 y.o. male, presented to the emergency room with increasing pain in his left foot.  This is been present for approximately 1-1/2 to 2 months. He had presented to urgent care in mid September with similar issues and was felt to have gout. This has been progressive and he is now developed an ulceration on the tip of his left fifth toe. PMH: HTN, and long time smoker. Admitted 11/10 for L critical limb ischemia s/p 11/12 Aortogram with bilateral iliac arteriogram and bilateral lower extremity runoff. s/p 11/16 s/p R to L fem fem and L fem AK pop with vein   Clinical Impression  Pt up in chair after working with OT. PTA pt living with wife in single story home with 3 steps to enter. 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 Pt is currently limited in safe mobility by 10/10 pain with L LE in dependent position and with weightbearing, at rest with foot elevated pt reports 7/10 pain. Pt is currently min guard for safety with transfers, pt requires increased effort to attain balance in standing. Pt is min guard for ambulation with RW. Pt will require a RW at discharge but will not have any additional PT needs. PT will continue to follow acutely for stair training.     Follow Up Recommendations No PT follow up;Supervision for mobility/OOB    Equipment Recommendations  Rolling walker with 5" wheels    Recommendations for Other Services       Precautions / Restrictions Precautions Precautions: Fall Restrictions Weight Bearing Restrictions: Yes LLE Weight Bearing: Weight bearing as tolerated      Mobility  Bed Mobility               General bed mobility comments: OOB in recliner    Transfers Overall transfer level: Needs assistance Equipment used: Rolling walker (2 wheeled) Transfers: Sit to/from [employer] Sit to Stand: Min guard         General transfer comment: pt stood prematurely as RW was not in place, pt requires min guard as he self steadies from initial LoB, reaching to RW  Ambulation/Gait Ambulation/Gait assistance: Min guard Gait Distance (Feet): 50 Feet Assistive device: Rolling walker (2 wheeled) Gait Pattern/deviations: Step-through pattern;Decreased weight shift to left;Decreased step length - right;Decreased stance time - left Gait velocity: slowed Gait velocity interpretation: <1.8 ft/sec, indicate of risk for recurrent falls General Gait Details: min guard for safety, decreasing in velocity due to increase in pain, vc for proximity to RW        Balance Overall balance assessment: Needs assistance Sitting-balance support: Bilateral upper extremity supported Sitting balance-Leahy Scale: Good     Standing balance support: Bilateral upper extremity supported Standing balance-Leahy Scale: Good Standing balance comment: standing at sink for grooming/standing at commode for toileting 1UE supported                             Pertinent Vitals/Pain Pain Assessment: 0-10 Pain Score: 10-Worst pain ever Pain Location: dorsum of L foot Pain Descriptors / Indicators: Grimacing;Guarding;Sharp;Shooting Pain Intervention(s): Limited activity within patient's tolerance;Monitored during session;Repositioned    Home Living Family/patient expects to be discharged to:: Private residence Living Arrangements: Spouse/significant other Available Help at Discharge: Family;Available 24 hours/day Type of Home: House Home Access: Stairs to enter Entrance Stairs-Rails: Doctor, general practice  of Steps: 3 Home Layout: One level Home Equipment: Bedside commode;Walker - 2 wheels      Prior Function Level of Independence: Independent         Comments: Working as a Surveyor, minerals; spouse recently had spinal surgery, but ambulatory     Hand Dominance    Dominant Hand: Right    Extremity/Trunk Assessment   Upper Extremity Assessment Upper Extremity Assessment: Defer to OT evaluation    Lower Extremity Assessment Lower Extremity Assessment: RLE deficits/detail;LLE deficits/detail RLE Deficits / Details: incision at groin, ROM WFL, strength grossly 4/5 RLE Sensation: WNL RLE Coordination: WNL LLE Deficits / Details: incision at groin and knee, pain decreasing ROM of knee, minimal toe ROM especially 5th toe, strength grossly 4/5 LLE Sensation: decreased light touch (decreased sensation to ankle) LLE Coordination: decreased fine motor    Cervical / Trunk Assessment Cervical / Trunk Assessment: Normal  Communication   Communication: No difficulties  Cognition Arousal/Alertness: Awake/alert Behavior During Therapy: WFL for tasks assessed/performed Overall Cognitive Status: Within Functional Limits for tasks assessed                                 General Comments: A/O x4      General Comments General comments (skin integrity, edema, etc.): HR max with ambulation 123bpm, SaO2 on RA >90%O2    Exercises     Assessment/Plan    PT Assessment Patient needs continued PT services  PT Problem List Decreased activity tolerance;Decreased balance;Impaired sensation;Pain;Decreased knowledge of use of DME       PT Treatment Interventions DME instruction;Gait training;Stair training;Functional mobility training;Therapeutic activities;Therapeutic exercise;Balance training;Cognitive remediation;Patient/family education    PT Goals (Current goals can be found in the Care Plan section)  Acute Rehab PT Goals Patient Stated Goal: to go home PT Goal Formulation: With patient Time For Goal Achievement: 12/30/19 Potential to Achieve Goals: Good    Frequency Min 3X/week   Barriers to discharge        Co-evaluation               AM-PAC PT "6 Clicks" Mobility  Outcome Measure Help needed turning from your back to  your side while in a flat bed without using bedrails?: None Help needed moving from lying on your back to sitting on the side of a flat bed without using bedrails?: None Help needed moving to and from a bed to a chair (including a wheelchair)?: A Little Help needed standing up from a chair using your arms (e.g., wheelchair or bedside chair)?: A Little Help needed to walk in hospital room?: A Little Help needed climbing 3-5 steps with a railing? : A Little 6 Click Score: 20    End of Session Equipment Utilized During Treatment: Gait belt Activity Tolerance: Patient limited by pain Patient left: in chair;with call bell/phone within reach;with restraints reapplied Nurse Communication: Mobility status PT Visit Diagnosis: Other abnormalities of gait and mobility (R26.89);Difficulty in walking, not elsewhere classified (R26.2);Pain Pain - Right/Left: Left Pain - part of body: Ankle and joints of foot    Time: 1002-1023 PT Time Calculation (min) (ACUTE ONLY): 21 min   Charges:   PT Evaluation $PT Eval Moderate Complexity: 1 Mod          [name] PT, DPT Acute Rehabilitation Services Pager [phone] Office [phone]   [name] 12/16/2019, 1:50 PM

## 2019-12-16 NOTE — Progress Notes (Signed)
Mobility Specialist: Progress Note   12/16/19 1753  Mobility  Activity Ambulated in hall  Level of Assistance Modified independent, requires aide device or extra time  Assistive Device Front wheel walker  Distance Ambulated (ft) 210 ft  Mobility Response Tolerated well  Mobility performed by Mobility specialist  Bed Position Semi-fowlers  $Mobility charge 1 Mobility   Pre-Mobility: 87 HR, 147/86 BP, 96% SpO2 Post-Mobility: 96 HR, 144/88 BP, 995 SpO2  Pt c/o pain in his L foot during ambulation but stated he was fine to continue ambulation. Pt is hopeful for discharge tomorrow.   [facility] Nomie Buchberger Mobility Specialist

## 2019-12-16 NOTE — Evaluation (Signed)
Occupational Therapy Evaluation Patient Details Name: [name] MRN: [id number] DOB: [birth year] Today's Date: 12/16/2019    History of Present Illness 64 y.o. male, presented to the emergency room with increasing pain in his left foot.  This is been present for approximately 1-1/2 to 2 months. He had presented to urgent care in mid September with similar issues and was felt to have gout. This has been progressive and he is now developed an ulceration on the tip of his left fifth toe. PMH: HTN, and long time smoker. Admitted 11/10 for L critical limb ischemia s/p 11/12 Aortogram with bilateral iliac arteriogram and bilateral lower extremity runoff. s/p 11/16 s/p R to L fem fem and L fem AK pop with vein    Clinical Impression   Pt PTA: pt living independently with spouse who had recent back surgery. Pt was working and driving. Pt currently set-upA to modA overall for all ADL; pt with severe pain and unable flex L hip enough for figure 4 technique or simulating tub transfer. Pt could benefit from continued OT skilled services for ADL and mobility. OT following acutely. Do not expect a need for follow-up OT.      Follow Up Recommendations  No OT follow up    Equipment Recommendations  None recommended by OT    Recommendations for Other Services       Precautions / Restrictions Precautions Precautions: Fall Restrictions Weight Bearing Restrictions: Yes LLE Weight Bearing: Weight bearing as tolerated      Mobility Bed Mobility Overal bed mobility: Modified Independent             General bed mobility comments: increased time, no use of rails    Transfers Overall transfer level: Needs assistance Equipment used: Rolling walker (2 wheeled) Transfers: Sit to/from [employer] Sit to Stand: Min guard Stand pivot transfers: Min guard       General transfer comment: minguardA for safety due to pain and first time getting up    Balance Overall balance  assessment: Needs assistance Sitting-balance support: Bilateral upper extremity supported Sitting balance-Leahy Scale: Good     Standing balance support: Bilateral upper extremity supported Standing balance-Leahy Scale: Good Standing balance comment: standing at sink for grooming/standing at commode for toileting 1UE supported                           ADL either performed or assessed with clinical judgement   ADL Overall ADL's : Needs assistance/impaired Eating/Feeding: Set up;Sitting   Grooming: Supervision/safety;Standing   Upper Body Bathing: Set up;Sitting   Lower Body Bathing: Moderate assistance;Sitting/lateral leans;Sit to/from stand Lower Body Bathing Details (indicate cue type and reason): unable to perform figure 4 so pt unable to complete task without severe pain Upper Body Dressing : Set up;Sitting   Lower Body Dressing: Moderate assistance;Sitting/lateral leans;Sit to/from stand;Cueing for safety Lower Body Dressing Details (indicate cue type and reason): may need AE education Toilet Transfer: Min guard;Cueing for safety;BSC;RW   Toileting- Clothing Manipulation and Hygiene: Minimal assistance;Cueing for safety;Sitting/lateral lean;Sit to/from stand Toileting - Clothing Manipulation Details (indicate cue type and reason): assist due to IV placements   Tub/Shower Transfer Details (indicate cue type and reason): unable to perform due to severe pain with hip hike  for transfer Functional mobility during ADLs: Min guard;Rolling walker;Cueing for safety General ADL Comments: Pt set-upA to modA overall for ADL tasks due to severe pain and decreased ability to reach for LB ADL.  Vision Baseline Vision/History: Wears glasses Wears Glasses: Reading only Patient Visual Report: No change from baseline Vision Assessment?: No apparent visual deficits     Perception     Praxis      Pertinent Vitals/Pain Pain Assessment: 0-10 Pain Score: 7  Pain Location:  groin incisional Pain Descriptors / Indicators: Grimacing;Guarding;Sharp;Shooting Pain Intervention(s): Limited activity within patient's tolerance;Premedicated before session;Repositioned     Hand Dominance Right   Extremity/Trunk Assessment Upper Extremity Assessment Upper Extremity Assessment: Overall WFL for tasks assessed   Lower Extremity Assessment Lower Extremity Assessment: Defer to PT evaluation;RLE deficits/detail;LLE deficits/detail RLE Deficits / Details: incision at groin, soreness LLE Deficits / Details: incision at groin, soreness   Cervical / Trunk Assessment Cervical / Trunk Assessment: Normal   Communication Communication Communication: No difficulties   Cognition Arousal/Alertness: Awake/alert Behavior During Therapy: WFL for tasks assessed/performed Overall Cognitive Status: Within Functional Limits for tasks assessed                                 General Comments: A/O x4   General Comments  HR 108 BPM, 98% O2; BP 131/91 s/p exertion    Exercises     Shoulder Instructions      Home Living Family/patient expects to be discharged to:: Private residence Living Arrangements: Spouse/significant other Available Help at Discharge: Family;Available 24 hours/day Type of Home: House Home Access: Stairs to enter [employer] of Steps: 3 Entrance Stairs-Rails: Right;Left Home Layout: One level     Bathroom Shower/Tub: Chief Strategy Officer: Standard     Home Equipment: Bedside commode;Walker - 2 wheels          Prior Functioning/Environment Level of Independence: Independent        Comments: Working as a Surveyor, minerals; spouse recently had spinal surgery, but ambulatory        OT Problem List: Decreased strength;Decreased activity tolerance;Impaired balance (sitting and/or standing);Decreased safety awareness;Pain;Increased edema      OT Treatment/Interventions: Self-care/ADL training;Therapeutic  exercise;Energy conservation;DME and/or AE instruction;Therapeutic activities;Patient/family education;Balance training    OT Goals(Current goals can be found in the care plan section) Acute Rehab OT Goals Patient Stated Goal: to go home OT Goal Formulation: With patient Time For Goal Achievement: 12/30/19 Potential to Achieve Goals: Good ADL Goals Pt Will Perform Lower Body Dressing: with set-up;with adaptive equipment;sitting/lateral leans;sit to/from stand Pt Will Perform Toileting - Clothing Manipulation and hygiene: with supervision;sitting/lateral leans;sit to/from stand Pt Will Perform Tub/Shower Transfer: with min guard assist;rolling walker  OT Frequency: Min 2X/week   Barriers to D/C:            Co-evaluation              AM-PAC OT "6 Clicks" Daily Activity     Outcome Measure Help from another person eating meals?: None Help from another person taking care of personal grooming?: A Little Help from another person toileting, which includes using toliet, bedpan, or urinal?: A Little Help from another person bathing (including washing, rinsing, drying)?: A Little Help from another person to put on and taking off regular upper body clothing?: None Help from another person to put on and taking off regular lower body clothing?: A Little 6 Click Score: 20   End of Session Equipment Utilized During Treatment: Gait belt;Rolling walker Nurse Communication: Mobility status  Activity Tolerance: Patient tolerated treatment well;Patient limited by lethargy Patient left: in chair;with call bell/phone within reach  OT Visit  Diagnosis: Unsteadiness on feet (R26.81);Muscle weakness (generalized) (M62.81);Pain Pain - part of body: Ankle and joints of foot (b/l)                Time: 1610-9604 OT Time Calculation (min): 31 min Charges:  OT General Charges $OT Visit: 1 Visit OT Evaluation $OT Eval Moderate Complexity: 1 Mod OT Treatments $Self Care/Home Management : 8-22  mins  Flora Lipps, OTR/L Acute Rehabilitation Services Pager: [phone] Office: [phone]   Florean Hoobler C 12/16/2019, 10:19 AM

## 2019-12-16 NOTE — Progress Notes (Addendum)
  Progress Note    12/16/2019 8:06 AM 1 Day Post-Op  Subjective:  Occasional sharp shooting pains radiating down LLE into the foot   Vitals:   12/15/19 2318 12/16/19 0354  BP: (!) 124/99 (!) 131/91  Pulse: 76 76  Resp: 20 20  Temp: 98.2 F (36.8 C) 98.3 F (36.8 C)  SpO2: 99% 97%   Physical Exam: Lungs:  Non labored Incisions:  All incisions unremarkable Extremities:  R PT and AT by doppler; L DP and PT by doppler Neurologic: A&O  CBC    Component Value Date/Time   WBC 11.6 (H) 12/16/2019 0500   RBC 3.61 (L) 12/16/2019 0500   HGB 11.5 (L) 12/16/2019 0500   HCT 33.9 (L) 12/16/2019 0500   PLT 321 12/16/2019 0500   MCV 93.9 12/16/2019 0500   MCH 31.9 12/16/2019 0500   MCHC 33.9 12/16/2019 0500   RDW 14.2 12/16/2019 0500   LYMPHSABS 2.6 12/09/2019 0252   MONOABS 0.4 12/09/2019 0252   EOSABS 0.3 12/09/2019 0252   BASOSABS 0.1 12/09/2019 0252    BMET    Component Value Date/Time   NA 134 (L) 12/16/2019 0500   K 3.7 12/16/2019 0500   CL 104 12/16/2019 0500   CO2 23 12/16/2019 0500   GLUCOSE 123 (H) 12/16/2019 0500   BUN 7 (L) 12/16/2019 0500   CREATININE 0.84 12/16/2019 0500   CALCIUM 8.9 12/16/2019 0500   GFRNONAA >60 12/16/2019 0500    INR    Component Value Date/Time   INR 1.0 12/15/2019 0708     Intake/Output Summary (Last 24 hours) at 12/16/2019 0806 Last data filed at 12/16/2019 0700 Gross per 24 hour  Intake 3271.19 ml  Output 4350 ml  Net -1078.81 ml     Assessment/Plan:  64 y.o. male is s/p R to L fem fem and L fem - AK pop with vein 1 Day Post-Op   Feet well perfused with pedal doppler signals OOB today Wean IV pain medication Possible d/c tomorrow   Emilie Rutter, PA-C [facility] [phone] 12/16/2019 8:06 AM   I have examined the patient, reviewed and agree with above.  Improved pain in his left foot.  Having surgical incisional pain controlled with meds.  Probable discharge to home tomorrow  Gretta Began, MD 12/16/2019 4:45 PM

## 2019-12-16 NOTE — Plan of Care (Signed)

## 2019-12-16 NOTE — Progress Notes (Signed)
ABI has been completed.   Preliminary results in CV Proc.   Blanch Media 12/16/2019 4:02 PM

## 2019-12-16 NOTE — Progress Notes (Signed)
PHARMACIST LIPID MONITORING   [name] is a 64 y.o. male admitted on 12/15/2019 with critical limb ischemia.  Pharmacy has been consulted to optimize lipid-lowering therapy with the indication of secondary prevention for clinical ASCVD.  Recent Labs:  Lipid Panel (last 6 months):   Lab Results  Component Value Date   CHOL 89 12/16/2019   TRIG 81 12/16/2019   HDL 32 (L) 12/16/2019   CHOLHDL 2.8 12/16/2019   VLDL 16 12/16/2019   LDLCALC 41 12/16/2019    Hepatic function panel (last 6 months):   Lab Results  Component Value Date   AST 19 12/15/2019   ALT 29 12/15/2019   ALKPHOS 57 12/15/2019   BILITOT 0.6 12/15/2019    SCr (since admission):   Serum creatinine: 0.84 mg/dL 50/03/70 4888 Estimated creatinine clearance: 90.9 mL/min  Current lipid-lowering therapy: Lipitor10mg /d Previous lipid-lowering therapies (if applicable): Na Documented or reported allergies or intolerances to lipid-lowering therapies (if applicable): None  Assessment:  LDL at goal <70; consider target LDL <55. Patient's LDL is currently 41  Recommendation per protocol:  Continue current lipid-lowering therapy.  Follow-up with:  Cardiology/Vascular MD  Follow-up labs after discharge:    Liver function panel and lipid panel in 8-12 weeks then annually  Plan: Con't current plan of care.   Edgard Debord S. Merilynn Finland, PharmD, BCPS Clinical Staff Pharmacist [email or web address] Pasty Spillers, PharmD 12/16/2019, 9:55 AM

## 2019-12-17 MED ORDER — ASPIRIN EC 81 MG PO TBEC: 81.0000 mg | DELAYED_RELEASE_TABLET | Freq: Every day | ORAL | 2 refills | Status: AC

## 2019-12-17 MED ORDER — OXYCODONE-ACETAMINOPHEN 10-325 MG PO TABS
1.0000 | ORAL_TABLET | Freq: Four times a day (QID) | ORAL | 0 refills | Status: DC | PRN
Start: 2019-12-17 — End: 2020-01-26

## 2019-12-17 NOTE — Progress Notes (Signed)
Physical Therapy Treatment Patient Details Name: [name] MRN: [id number] DOB: 1955-07-28 Today's Date: 12/17/2019    History of Present Illness 64 y.o. male, presented to the emergency room with increasing pain in his left foot.  This is been present for approximately 1-1/2 to 2 months. He had presented to urgent care in mid September with similar issues and was felt to have gout. This has been progressive and he is now developed an ulceration on the tip of his left fifth toe. PMH: HTN, and long time smoker. Admitted 11/10 for L critical limb ischemia s/p 11/12 Aortogram with bilateral iliac arteriogram and bilateral lower extremity runoff. s/p 11/16 s/p R to L fem fem and L fem AK pop with vein     PT Comments    Pt doing well with mobility. Ready for dc from PT standpoint.     Follow Up Recommendations  No PT follow up;Supervision for mobility/OOB     Equipment Recommendations  Rolling walker with 5" wheels    Recommendations for Other Services       Precautions / Restrictions Precautions Precautions: Fall    Mobility  Bed Mobility Overal bed mobility: Modified Independent                Transfers Overall transfer level: Needs assistance Equipment used: Rolling walker (2 wheeled) Transfers: Sit to/from Stand Sit to Stand: Supervision Stand pivot transfers: Supervision       General transfer comment: supervision for lines  Ambulation/Gait Ambulation/Gait assistance: Supervision Gait Distance (Feet): 150 Feet Assistive device: Rolling walker (2 wheeled) Gait Pattern/deviations: Step-through pattern;Decreased weight shift to left;Decreased step length - right;Decreased stance time - left;Antalgic Gait velocity: decr Gait velocity interpretation: 1.31 - 2.62 ft/sec, indicative of limited community ambulator     Stairs Stairs: Yes Stairs assistance: Supervision Stair Management: Two rails;Step to pattern;Forwards;Backwards Number of Stairs:  2 General stair comments: Verbal cues for technique. Up forward and stepped down backwards to avoid having to turn around on stairs.    Wheelchair Mobility    Modified Rankin (Stroke Patients Only)       Balance Overall balance assessment: Mild deficits observed, not formally tested                                          Cognition Arousal/Alertness: Awake/alert Behavior During Therapy: WFL for tasks assessed/performed Overall Cognitive Status: Within Functional Limits for tasks assessed                                        Exercises      General Comments        Pertinent Vitals/Pain Pain Assessment: Faces Faces Pain Scale: Hurts whole lot Pain Location: dorsum of L foot Pain Descriptors / Indicators: Burning Pain Intervention(s): Monitored during session;Repositioned    Home Living                      Prior Function            PT Goals (current goals can now be found in the care plan section) Acute Rehab PT Goals Patient Stated Goal: to go home Progress towards PT goals: Progressing toward goals    Frequency    Min 3X/week      PT Plan Current plan  remains appropriate    Co-evaluation              AM-PAC PT "6 Clicks" Mobility   Outcome Measure  Help needed turning from your back to your side while in a flat bed without using bedrails?: None Help needed moving from lying on your back to sitting on the side of a flat bed without using bedrails?: None Help needed moving to and from a bed to a chair (including a wheelchair)?: None Help needed standing up from a chair using your arms (e.g., wheelchair or bedside chair)?: None Help needed to walk in hospital room?: None Help needed climbing 3-5 steps with a railing? : A Little 6 Click Score: 23    End of Session   Activity Tolerance: Patient tolerated treatment well Patient left: with call bell/phone within reach;in bed;with family/visitor  present   PT Visit Diagnosis: Other abnormalities of gait and mobility (R26.89);Difficulty in walking, not elsewhere classified (R26.2);Pain Pain - Right/Left: Left Pain - part of body: Ankle and joints of foot     Time: 1037-1050 PT Time Calculation (min) (ACUTE ONLY): 13 min  Charges:  $Gait Training: 8-22 mins                     [facility] PT Acute Rehabilitation Services Pager [phone] Office [phone]    Angelina Ok [facility] 12/17/2019, 12:59 PM

## 2019-12-17 NOTE — Progress Notes (Signed)
Discharge instructions provided to patient. Site care and follow-up appointments reviewed. Rolling walker delivered to room. All questions answered. IV removed. Patient to be escorted home by his wife.   Allegra Grana RN

## 2019-12-17 NOTE — TOC Transition Note (Signed)
Transition of Care (TOC) - CM/SW Discharge Note Donn Pierini RN, BSN Transitions of Care Unit 4E- RN Case Manager See Treatment Team for direct phone #    Patient Details  Name: [name] MRN: [id number] Date of Birth: 02/20/1955  Transition of Care [facility]) CM/SW Contact:  Darrold Span, RN Phone Number: 12/17/2019, 11:51 AM   Clinical Narrative:    Pt stable for transition home, notified pt needed RW for home, order placed and call made to [employer] line- RW to be delivered to room prior to discharge- no further TOC needs noted.   Final next level of care: Home/Self Care Barriers to Discharge: No Barriers Identified   Patient Goals and CMS Choice     Choice offered to / list presented to : Patient  Discharge Placement               Home        Discharge Plan and Services                DME Arranged: Walker rolling DME Agency: [employer] Date DME Agency Contacted: 12/17/19 Time DME Agency Contacted: 1000 Representative spoke with at DME Agency: Silvio Pate HH Arranged: NA HH Agency: NA        Social Determinants of Health (SDOH) Interventions     Readmission Risk Interventions No flowsheet data found.

## 2019-12-17 NOTE — Progress Notes (Signed)
Occupational Therapy Treatment Patient Details Name: [name] MRN: [id number] DOB: October 07, 1955 Today's Date: 12/17/2019    History of present illness 64 y.o. male, presented to the emergency room with increasing pain in his left foot.  This is been present for approximately 1-1/2 to 2 months. He had presented to urgent care in mid September with similar issues and was felt to have gout. This has been progressive and he is now developed an ulceration on the tip of his left fifth toe. PMH: HTN, and long time smoker. Admitted 11/10 for L critical limb ischemia s/p 11/12 Aortogram with bilateral iliac arteriogram and bilateral lower extremity runoff. s/p 11/16 s/p R to L fem fem and L fem AK pop with vein    OT comments  Patient getting ready for discharge.  OT able to demo and have patient teach back lower body dressing at edge of bed. Patient able to use modified positioning, and dangle underwear and pants to donn without assist.  Also educated with teach back for sitting and standing from lower toilet, with stepping L leg out in front prior to standing and sitting to reduce strain and stress to surgical leg.  Good understanding noted.  Lastly, OT had patient simulate tub transfers leading with non surgical leg.  Patient placed in transport chair awaiting to leave.  No further OT needs post acute.      Follow Up Recommendations  No OT follow up    Equipment Recommendations  None recommended by OT    Recommendations for Other Services      Precautions / Restrictions         Mobility Bed Mobility Overal bed mobility: Modified Independent                Transfers     Transfers: Sit to/from Stand;Stand Pivot Transfers Sit to Stand: Supervision Stand pivot transfers: Supervision            Balance Overall balance assessment: Mild deficits observed, not formally tested                                         ADL either performed or assessed with  clinical judgement   ADL                   Upper Body Dressing : Sitting;Set up   Lower Body Dressing: Supervision/safety;Sit to/from stand Lower Body Dressing Details (indicate cue type and reason): able to dangle and bend leg a little better today, still painful.  Discussed LH Reacher             Functional mobility during ADLs: Modified independent                      General Comments      Pertinent Vitals/ Pain       Pain Assessment: Faces Faces Pain Scale: Hurts whole lot Pain Location: surgical site Pain Descriptors / Indicators: Grimacing;Guarding;Sharp Pain Intervention(s): Monitored during session                                                          Frequency  Min 2X/week        Progress  Toward Goals  OT Goals(current goals can now be found in the care plan section)  Progress towards OT goals: Progressing toward goals  Acute Rehab OT Goals Patient Stated Goal: to go home OT Goal Formulation: With patient Time For Goal Achievement: 12/30/19 Potential to Achieve Goals: Good  Plan Discharge plan remains appropriate    Co-evaluation                 AM-PAC OT "6 Clicks" Daily Activity     Outcome Measure   Help from another person eating meals?: None Help from another person taking care of personal grooming?: None Help from another person toileting, which includes using toliet, bedpan, or urinal?: None Help from another person bathing (including washing, rinsing, drying)?: A Little Help from another person to put on and taking off regular upper body clothing?: None Help from another person to put on and taking off regular lower body clothing?: A Little 6 Click Score: 22    End of Session Equipment Utilized During Treatment: Rolling walker  OT Visit Diagnosis: Unsteadiness on feet (R26.81);Pain Pain - Right/Left: Left Pain - part of body: Ankle and joints of foot   Activity Tolerance Patient  tolerated treatment well   Patient Left in chair   Nurse Communication Other (comment) (patient dressed)        Time: 5176-1607 OT Time Calculation (min): 11 min  Charges: OT General Charges $OT Visit: 1 Visit OT Treatments $Self Care/Home Management : 8-22 mins  12/17/2019  Rich, OTR/L  Acute Rehabilitation Services  Office:  [phone]    Suzanna Obey 12/17/2019, 11:27 AM

## 2019-12-17 NOTE — Progress Notes (Addendum)
  Progress Note    12/17/2019 7:28 AM 2 Days Post-Op  Subjective: He complains of burning pain behind his left ankle.  Otherwise doing well and needing minimal assistance with ambulation with rolling walker.   Vitals:   12/16/19 2337 12/17/19 0348  BP: 118/81 125/90  Pulse: 90 87  Resp: 17 20  Temp: 98.2 F (36.8 C) 98.3 F (36.8 C)  SpO2: 98% 94%    Physical Exam: Cardiac: Heart rate and rhythm are regular Lungs: Clear to auscultation bilaterally Incisions: Bilateral groin incisions and left thigh incision are well approximated.  No bleeding, drainage or hematoma Extremities: Both feet are warm and well-perfused.  Motor function and sensation intact.  He has brisk dorsalis pedis and posterior tibial Doppler signals.  Peroneal signal is present dampened. Left 5th toe ulcer dry and stable. Abdomen: Palpable pulse and femorofemoral bypass.  Abdomen is soft and nondistended.  CBC    Component Value Date/Time   WBC 11.6 (H) 12/16/2019 0500   RBC 3.61 (L) 12/16/2019 0500   HGB 11.5 (L) 12/16/2019 0500   HCT 33.9 (L) 12/16/2019 0500   PLT 321 12/16/2019 0500   MCV 93.9 12/16/2019 0500   MCH 31.9 12/16/2019 0500   MCHC 33.9 12/16/2019 0500   RDW 14.2 12/16/2019 0500   LYMPHSABS 2.6 12/09/2019 0252   MONOABS 0.4 12/09/2019 0252   EOSABS 0.3 12/09/2019 0252   BASOSABS 0.1 12/09/2019 0252    BMET    Component Value Date/Time   NA 134 (L) 12/16/2019 0500   K 3.7 12/16/2019 0500   CL 104 12/16/2019 0500   CO2 23 12/16/2019 0500   GLUCOSE 123 (H) 12/16/2019 0500   BUN 7 (L) 12/16/2019 0500   CREATININE 0.84 12/16/2019 0500   CALCIUM 8.9 12/16/2019 0500   GFRNONAA >60 12/16/2019 0500     Intake/Output Summary (Last 24 hours) at 12/17/2019 0728 Last data filed at 12/16/2019 2340 Gross per 24 hour  Intake 720 ml  Output 1025 ml  Net -305 ml    HOSPITAL MEDICATIONS Scheduled Meds: . atorvastatin  10 mg Oral Daily  . Chlorhexidine Gluconate Cloth  6 each Topical  Daily  . docusate sodium  100 mg Oral Daily  . heparin  5,000 Units Subcutaneous Q8H  . influenza vac split quadrivalent PF  0.5 mL Intramuscular Tomorrow-1000  . lisinopril  10 mg Oral Daily  . pantoprazole  40 mg Oral Daily   Continuous Infusions: . sodium chloride    . magnesium sulfate bolus IVPB     PRN Meds:.sodium chloride, acetaminophen **OR** acetaminophen, alum & mag hydroxide-simeth, gabapentin, guaiFENesin-dextromethorphan, hydrALAZINE, labetalol, magnesium sulfate bolus IVPB, metoprolol tartrate, morphine injection, ondansetron, oxyCODONE-acetaminophen, phenol, potassium chloride  Assessment: 64 year old male status post right to left femoral to femoral bypass with left femoral to above-knee popliteal bypass with vein.  Lower extremity well-perfused.  Left 5th toe ulcer is dry. Vital signs are stable and he is afebrile.  Pain well controlled  Plan: -Discharge home with rolling walker today. Continue statin. Add aspirin 81mg  daily. -DVT prophylaxis: Heparin subcutaneously   , PA-C [facility] [phone] 12/17/2019  7:28 AM   I have examined the patient, reviewed and agree with above.  Doing well and mobilizing without difficulty.  Home today.  12/19/2019, MD 12/17/2019 9:17 AM

## 2019-12-17 NOTE — Discharge Summary (Signed)
Bypass Discharge Summary Patient ID: [name] [id number] 64 y.o. 1955/12/02  Admit date: 12/15/2019  Discharge date and time: 12/17/2019 11:29 AM   Admitting Physician: Larina Earthly, MD   Discharge Physician: Larina Earthly, MD  Admission Diagnoses: Critical lower limb ischemia [facility]) [I70.229]  Discharge Diagnoses: Critical lower limb ischemia (HCC) [I70.229]; left 5th toe ischemic ulcer  Admission Condition: good  Discharged Condition: good  Indication for Admission: critical lower extremity ischemia  Hospital Course: On the day of admission the patient was taken to the operating room where he underwent to left femoral to femoral bypass with left femoral to above-knee popliteal bypass with vein. He tolerated the procedure well. He had Dopplerable pedal pulses and a palpable fem-fem pulse immediately post-op. On POD 1, his vital signs were stable and he was afebrile.  His pain medication was adjusted.  He underwent physical therapy and Occupational Therapy evaluations.  He was able to ambulate independently with rolling walker.  He had dopplerable pedal pulses and was voiding spontaneously.  He was tolerating his diet.  Hemoglobin stable On POD 2, his pain was controlled with oral medications and his incisions were healing without signs of infection, bleeding or hematoma.  He had dopplerable pedal pulses.  He was ready for discharge home in satisfactory condition   Consults: None  Treatments: surgery: See above   Disposition: Discharge disposition: 01-Home or Self Care       - For [facility] Registry use ---  Post-op:  Wound infection: No  Graft infection: No  Transfusion: No  If yes, 0 units given New Arrhythmia: No Patency judged by: [x ] Dopper only, [ ]  Palpable graft pulse, [ ]  Palpable distal pulse, [ ]  ABI inc. > 0.15, [ ]  Duplex Discharge ABI: R 0.45, L 0.55 Discharge TBI: R , L  D/C Ambulatory Status: Ambulatory  Complications: MI: [ ]  No, [ ]  Troponin  only, [ ]  EKG or Clinical CHF: No Resp failure: [ ]  none, [ ]  Pneumonia, [ ]  Ventilator Chg in renal function: [ ]  none, [ ]  Inc. Cr > 0.5, [ ]  Temp. Dialysis, [ ]  Permanent dialysis Stroke: [ ]  None, [ ]  Minor, [ ]  Major Return to OR: No  Reason for return to OR: [ ]  Bleeding, [ ]  Infection, [ ]  Thrombosis, [ ]  Revision  Discharge medications: Statin use:  Yes ASA use:  Yes Plavix use:  No  for medical reason Not indicated Beta blocker use: No  for medical reason Not prescribed Coumadin use: No  for medical reason Not indicated    Patient Instructions:  Allergies as of 12/17/2019   No Known Allergies     Medication List    STOP taking these medications   oxyCODONE-acetaminophen 5-325 MG tablet Commonly known as: PERCOCET/ROXICET Replaced by: oxyCODONE-acetaminophen 10-325 MG tablet     TAKE these medications   acetaminophen 500 MG tablet Commonly known as: TYLENOL Take 500-1,000 mg by mouth every 6 (six) hours as needed (for pain.).   aspirin EC 81 MG tablet Take 1 tablet (81 mg total) by mouth daily. Swallow whole.   atorvastatin 10 MG tablet Commonly known as: LIPITOR Take 10 mg by mouth daily.   lisinopril 10 MG tablet Commonly known as: ZESTRIL Take 10 mg by mouth daily.   oxyCODONE-acetaminophen 10-325 MG tablet Commonly known as: Percocet Take 1 tablet by mouth every 6 (six) hours as needed for pain. Replaces: oxyCODONE-acetaminophen 5-325 MG tablet   predniSONE 20 MG tablet Commonly known  as: DELTASONE Take 2 tablets daily with breakfast.            Durable Medical Equipment  (From admission, onward)         Start     Ordered   12/17/19 0942  For home use only DME Walker rolling  Once       Question Answer Comment  Walker: With 5 Inch Wheels   Patient needs a walker to treat with the following condition Weakness      12/17/19 0941         Activity: no lifting, driving, or strenuous exercise for 2 weeks Diet: cardiac diet Wound  Care: keep wound clean and dry  Follow-up with Dr. Lenell Antu in 4 weeks.  Signed: Milinda Antis 12/17/2019 12:35 PM

## 2019-12-17 NOTE — Discharge Instructions (Signed)
 [facility] [place]  Discharge instructions  Lower Extremity Bypass Surgery  Please refer to the following instruction for your post-procedure care. Your surgeon or physician assistant will discuss any changes with you.  Activity  You are encouraged to walk as much as you can. You can slowly return to normal activities during the month after your surgery. Avoid strenuous activity and heavy lifting until your doctor tells you it's OK. Avoid activities such as vacuuming or swinging a golf club. Do not drive until your doctor give the OK and you are no longer taking prescription pain medications. It is also normal to have difficulty with sleep habits, eating and bowel movement after surgery. These will go away with time.  Bathing/Showering  You may shower after you go home. Do not soak in a bathtub, hot tub, or swim until the incision heals completely.  Incision Care  Clean your incision with mild soap and water. Shower every day. Pat the area dry with a clean towel. You do not need a bandage unless otherwise instructed. Do not apply any ointments or creams to your incision. If you have open wounds you will be instructed how to care for them or a visiting nurse may be arranged for you. If you have staples or sutures along your incision they will be removed at your post-op appointment. You may have skin glue on your incision. Do not peel it off. It will come off on its own in about one week. If you have a great deal of moisture in your groin, use a gauze help keep this area dry.  Diet  Resume your normal diet. There are no special food restrictions following this procedure. A low fat/ low cholesterol diet is recommended for all patients with vascular disease. In order to heal from your surgery, it is CRITICAL to get adequate nutrition. Your body requires vitamins, minerals, and protein. Vegetables are the best source of vitamins and minerals. Vegetables also provide the  perfect balance of protein. Processed food has little nutritional value, so try to avoid this.  Medications  Resume taking all your medications unless your doctor or nurse practitioner tells you not to. If your incision is causing pain, you may take over-the-counter pain relievers such as acetaminophen (Tylenol). If you were prescribed a stronger pain medication, please aware these medication can cause nausea and constipation. Prevent nausea by taking the medication with a snack or meal. Avoid constipation by drinking plenty of fluids and eating foods with high amount of fiber, such as fruits, vegetables, and grains. Take Colase 100 mg (an over-the-counter stool softener) twice a day as needed for constipation. Do not take Tylenol if you are taking prescription pain medications.  Follow Up  Our office will schedule a follow up appointment 2-3 weeks following discharge.  Please call us immediately for any of the following conditions  Severe or worsening pain in your legs or feet while at rest or while walking Increase pain, redness, warmth, or drainage (pus) from your incision site(s) Fever of 101 degree or higher The swelling in your leg with the bypass suddenly worsens and becomes more painful than when you were in the hospital If you have been instructed to feel your graft pulse then you should do so every day. If you can no longer feel this pulse, call the office immediately. Not all patients are given this instruction.  Leg swelling is common after leg bypass surgery.  The swelling should improve over a few months   following surgery. To improve the swelling, you may elevate your legs above the level of your heart while you are sitting or resting. Your surgeon or physician assistant may ask you to apply an ACE wrap or wear compression (TED) stockings to help to reduce swelling.  Reduce your risk of vascular disease  Stop smoking. If you would like help call [facility] at 1-800-QUIT-NOW  (1-800-784-8669) or [place] at 336-586-4000.  Manage your cholesterol Maintain a desired weight Control your diabetes weight Control your diabetes Keep your blood pressure down  If you have any questions, please call the office at [phone]   

## 2019-12-21 ENCOUNTER — Telehealth: Payer: Self-pay

## 2019-12-21 NOTE — Telephone Encounter (Signed)
Patient called c/o pain in L foot. Says it is a 7/10 and unchanged from hospital D/C. He is s/p femfem and fem pop bypass on 11/16. Denies color change and says it is still warm. Sent message.

## 2019-12-28 ENCOUNTER — Encounter (HOSPITAL_COMMUNITY): Payer: Self-pay | Admitting: Vascular Surgery

## 2020-01-12 ENCOUNTER — Other Ambulatory Visit: Payer: Self-pay

## 2020-01-12 DIAGNOSIS — I70229 Atherosclerosis of native arteries of extremities with rest pain, unspecified extremity: Secondary | ICD-10-CM

## 2020-01-26 ENCOUNTER — Encounter: Payer: Self-pay | Admitting: Vascular Surgery

## 2020-01-26 ENCOUNTER — Ambulatory Visit (INDEPENDENT_AMBULATORY_CARE_PROVIDER_SITE_OTHER)
Admission: RE | Admit: 2020-01-26 | Discharge: 2020-01-26 | Disposition: A | Discharge status: Home / Self Care | Payer: 59 | Source: Ambulatory Visit | Attending: Vascular Surgery | Admitting: Vascular Surgery

## 2020-01-26 ENCOUNTER — Ambulatory Visit (INDEPENDENT_AMBULATORY_CARE_PROVIDER_SITE_OTHER): Payer: 59 | Admitting: Vascular Surgery

## 2020-01-26 ENCOUNTER — Other Ambulatory Visit: Payer: Self-pay

## 2020-01-26 ENCOUNTER — Ambulatory Visit (HOSPITAL_COMMUNITY)
Admission: RE | Admit: 2020-01-26 | Discharge: 2020-01-26 | Disposition: A | Discharge status: Home / Self Care | Payer: 59 | Source: Ambulatory Visit | Attending: Vascular Surgery | Admitting: Vascular Surgery

## 2020-01-26 VITALS — BP 117/80 | HR 57 | Temp 97.9°F | Resp 20 | Ht 67.0 in | Wt 163.0 lb

## 2020-01-26 DIAGNOSIS — I70229 Atherosclerosis of native arteries of extremities with rest pain, unspecified extremity: Secondary | ICD-10-CM

## 2020-01-26 DIAGNOSIS — Z95828 Presence of other vascular implants and grafts: Secondary | ICD-10-CM

## 2020-01-26 NOTE — Progress Notes (Signed)
   ASSESSMENT & PLAN:  [name] is a 64 y.o. male status post right-to-left femoral-femoral bypass graft with Dacron and left femoral - above knee popliteal bypass graft with GSV 12/15/19.  All incisions healed. Rest pain resolved. Left first and second toe with mild discoloration. Will observe. Return to care in 2 weeks for re-check  SUBJECTIVE:  Feels much better. Left first/second toe occasionally bothering him. Otherwise much better.   OBJECTIVE:  BP 117/80 (BP Location: Left Arm, Patient Position: Sitting, Cuff Size: Large)   Pulse (!) 57   Temp 97.9 F (36.6 C)   Resp 20   Ht 5\' 7"  (1.702 m)   Wt 163 lb (73.9 kg)   SpO2 96%   BMI 25.53 kg/m   Constitutional: well appearing. no acute distress. Cardiac: rrr. Vascular: all incisions healed well. Pulmonary: unlabored Extremities: warm. Left first and second toes mild cyanosis.   CBC Latest Ref Rng & Units 12/16/2019 12/15/2019 12/10/2019  WBC 4.0 - 10.5 K/uL 11.6(H) 7.4 -  Hemoglobin 13.0 - 17.0 g/dL 11.5(L) 12.7(L) 12.9(L)  Hematocrit 39.0 - 52.0 % 33.9(L) 38.1(L) 38.0(L)  Platelets 150 - 400 K/uL 321 336 -     CMP Latest Ref Rng & Units 12/16/2019 12/15/2019 12/15/2019  Glucose 70 - 99 mg/dL 12/17/2019) - 277(O)  BUN 8 - 23 mg/dL 7(L) - 7(L)  Creatinine 0.61 - 1.24 mg/dL 242(P 5.36 1.44  Sodium 135 - 145 mmol/L 134(L) - 138  Potassium 3.5 - 5.1 mmol/L 3.7 - 3.8  Chloride 98 - 111 mmol/L 104 - 104  CO2 22 - 32 mmol/L 23 - 25  Calcium 8.9 - 10.3 mg/dL 8.9 - 9.4  Total Protein 6.5 - 8.1 g/dL - - 6.5  Total Bilirubin 0.3 - 1.2 mg/dL - - 0.6  Alkaline Phos 38 - 126 U/L - - 57  AST 15 - 41 U/L - - 19  ALT 0 - 44 U/L - - 29    CrCl cannot be calculated (Patient's most recent lab result is older than the maximum 21 days allowed.).  ABI 01/26/20        01/28/20. Rande Brunt, MD [facility] Phone Number: [phone] 01/26/2020 5:00 PM

## 2020-02-09 ENCOUNTER — Encounter: Payer: Self-pay | Admitting: Vascular Surgery

## 2020-02-09 ENCOUNTER — Other Ambulatory Visit: Payer: Self-pay

## 2020-02-09 ENCOUNTER — Ambulatory Visit: Payer: Self-pay | Admitting: Vascular Surgery

## 2020-02-09 VITALS — BP 126/91 | HR 74 | Temp 98.6°F | Resp 20 | Ht 67.0 in | Wt 163.0 lb

## 2020-02-09 DIAGNOSIS — Z9862 Peripheral vascular angioplasty status: Secondary | ICD-10-CM

## 2020-02-09 NOTE — Progress Notes (Signed)
   ASSESSMENT & PLAN:  [name] is a 65 y.o. male status post right-to-left femoral-femoral bypass graft with Dacron and left femoral - above knee popliteal bypass graft with GSV 12/15/19.  All incisions healed. Rest pain resolved. Left first and second toe discoloration resolved. Follow up in 3 months with ABI and LLE arterial duplex  SUBJECTIVE:  Feels much better. Left first/second toe resolved.   OBJECTIVE:  BP (!) 126/91 (BP Location: Left Arm, Patient Position: Sitting, Cuff Size: Normal)   Pulse 74   Temp 98.6 F (37 C)   Resp 20   Ht 5\' 7"  (1.702 m)   Wt 163 lb (73.9 kg)   SpO2 99%   BMI 25.53 kg/m   Constitutional: well appearing. no acute distress. Cardiac: rrr. Vascular: all incisions healed well. Pulmonary: unlabored Extremities: warm. Left first and second toes resolved cyanosis.   CBC Latest Ref Rng & Units 12/16/2019 12/15/2019 12/10/2019  WBC 4.0 - 10.5 K/uL 11.6(H) 7.4 -  Hemoglobin 13.0 - 17.0 g/dL 11.5(L) 12.7(L) 12.9(L)  Hematocrit 39.0 - 52.0 % 33.9(L) 38.1(L) 38.0(L)  Platelets 150 - 400 K/uL 321 336 -     CMP Latest Ref Rng & Units 12/16/2019 12/15/2019 12/15/2019  Glucose 70 - 99 mg/dL 12/17/2019) - 008(Q)  BUN 8 - 23 mg/dL 7(L) - 7(L)  Creatinine 0.61 - 1.24 mg/dL 761(P 5.09 3.26  Sodium 135 - 145 mmol/L 134(L) - 138  Potassium 3.5 - 5.1 mmol/L 3.7 - 3.8  Chloride 98 - 111 mmol/L 104 - 104  CO2 22 - 32 mmol/L 23 - 25  Calcium 8.9 - 10.3 mg/dL 8.9 - 9.4  Total Protein 6.5 - 8.1 g/dL - - 6.5  Total Bilirubin 0.3 - 1.2 mg/dL - - 0.6  Alkaline Phos 38 - 126 U/L - - 57  AST 15 - 41 U/L - - 19  ALT 0 - 44 U/L - - 29    CrCl cannot be calculated (Patient's most recent lab result is older than the maximum 21 days allowed.).  ABI 01/26/20        01/28/20. Rande Brunt, MD [facility] Phone Number: [phone] 02/09/2020 10:20 AM

## 2020-02-10 ENCOUNTER — Other Ambulatory Visit: Payer: Self-pay

## 2020-02-10 DIAGNOSIS — Z9862 Peripheral vascular angioplasty status: Secondary | ICD-10-CM

## 2020-03-14 ENCOUNTER — Other Ambulatory Visit: Payer: Self-pay | Admitting: Family

## 2020-03-14 DIAGNOSIS — R911 Solitary pulmonary nodule: Secondary | ICD-10-CM

## 2020-04-04 ENCOUNTER — Ambulatory Visit
Admission: RE | Admit: 2020-04-04 | Discharge: 2020-04-04 | Disposition: A | Discharge status: Home / Self Care | Payer: Medicare HMO | Source: Ambulatory Visit | Attending: Family | Admitting: Family

## 2020-04-04 ENCOUNTER — Other Ambulatory Visit: Payer: Self-pay

## 2020-04-04 DIAGNOSIS — R911 Solitary pulmonary nodule: Secondary | ICD-10-CM

## 2020-05-09 ENCOUNTER — Ambulatory Visit (INDEPENDENT_AMBULATORY_CARE_PROVIDER_SITE_OTHER): Payer: Medicare HMO | Admitting: Physician Assistant

## 2020-05-09 ENCOUNTER — Other Ambulatory Visit: Payer: Self-pay

## 2020-05-09 ENCOUNTER — Ambulatory Visit (HOSPITAL_COMMUNITY)
Admission: RE | Admit: 2020-05-09 | Discharge: 2020-05-09 | Disposition: A | Discharge status: Home / Self Care | Payer: Medicare HMO | Source: Ambulatory Visit | Attending: Vascular Surgery | Admitting: Vascular Surgery

## 2020-05-09 ENCOUNTER — Ambulatory Visit (INDEPENDENT_AMBULATORY_CARE_PROVIDER_SITE_OTHER)
Admission: RE | Admit: 2020-05-09 | Discharge: 2020-05-09 | Disposition: A | Discharge status: Home / Self Care | Payer: Medicare HMO | Source: Ambulatory Visit | Attending: Vascular Surgery | Admitting: Vascular Surgery

## 2020-05-09 VITALS — BP 159/96 | HR 64 | Temp 98.4°F | Resp 20 | Ht 67.0 in | Wt 167.3 lb

## 2020-05-09 DIAGNOSIS — Z9862 Peripheral vascular angioplasty status: Secondary | ICD-10-CM

## 2020-05-09 DIAGNOSIS — I739 Peripheral vascular disease, unspecified: Secondary | ICD-10-CM | POA: Diagnosis not present
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
## 2020-05-10 ENCOUNTER — Other Ambulatory Visit (HOSPITAL_COMMUNITY)
Admission: RE | Admit: 2020-05-10 | Discharge: 2020-05-10 | Disposition: A | Discharge status: Home / Self Care | Payer: Medicare HMO | Source: Ambulatory Visit | Attending: Vascular Surgery | Admitting: Vascular Surgery

## 2020-05-10 DIAGNOSIS — Z20822 Contact with and (suspected) exposure to covid-19: Secondary | ICD-10-CM | POA: Diagnosis not present

## 2020-05-10 DIAGNOSIS — Z01812 Encounter for preprocedural laboratory examination: Secondary | ICD-10-CM | POA: Insufficient documentation

## 2020-05-10 LAB — SARS CORONAVIRUS 2 (TAT 6-24 HRS): SARS Coronavirus 2: NEGATIVE

## 2020-05-11 ENCOUNTER — Ambulatory Visit (HOSPITAL_COMMUNITY)
Admission: RE | Admit: 2020-05-11 | Discharge: 2020-05-11 | Disposition: A | Discharge status: Home / Self Care | Payer: Medicare HMO | Attending: Vascular Surgery | Admitting: Vascular Surgery

## 2020-05-11 ENCOUNTER — Encounter (HOSPITAL_COMMUNITY)
Admission: RE | Disposition: A | Discharge status: Home / Self Care | Payer: Self-pay | Source: Home / Self Care | Attending: Vascular Surgery

## 2020-05-11 ENCOUNTER — Encounter (HOSPITAL_COMMUNITY): Payer: Self-pay | Admitting: Vascular Surgery

## 2020-05-11 ENCOUNTER — Other Ambulatory Visit: Payer: Self-pay

## 2020-05-11 DIAGNOSIS — T82868A Thrombosis of vascular prosthetic devices, implants and grafts, initial encounter: Secondary | ICD-10-CM | POA: Diagnosis not present

## 2020-05-11 DIAGNOSIS — I10 Essential (primary) hypertension: Secondary | ICD-10-CM | POA: Diagnosis not present

## 2020-05-11 DIAGNOSIS — Z87891 Personal history of nicotine dependence: Secondary | ICD-10-CM | POA: Diagnosis not present

## 2020-05-11 DIAGNOSIS — Z7982 Long term (current) use of aspirin: Secondary | ICD-10-CM | POA: Diagnosis not present

## 2020-05-11 DIAGNOSIS — I70212 Atherosclerosis of native arteries of extremities with intermittent claudication, left leg: Secondary | ICD-10-CM | POA: Insufficient documentation

## 2020-05-11 DIAGNOSIS — Z79899 Other long term (current) drug therapy: Secondary | ICD-10-CM | POA: Diagnosis not present

## 2020-05-11 HISTORY — PX: ABDOMINAL AORTOGRAM W/LOWER EXTREMITY: CATH118223

## 2020-05-11 LAB — POCT I-STAT, CHEM 8
BUN: 31 mg/dL — ABNORMAL HIGH (ref 8–23)
Calcium, Ion: 1.18 mmol/L (ref 1.15–1.40)
Chloride: 107 mmol/L (ref 98–111)
Creatinine, Ser: 0.9 mg/dL (ref 0.61–1.24)
Glucose, Bld: 105 mg/dL — ABNORMAL HIGH (ref 70–99)
HCT: 39 % (ref 39.0–52.0)
Hemoglobin: 13.3 g/dL (ref 13.0–17.0)
Potassium: 5.3 mmol/L — ABNORMAL HIGH (ref 3.5–5.1)
Sodium: 138 mmol/L (ref 135–145)
TCO2: 24 mmol/L (ref 22–32)

## 2020-05-11 SURGERY — ABDOMINAL AORTOGRAM W/LOWER EXTREMITY
Anesthesia: LOCAL | Laterality: Right

## 2020-05-11 MED ORDER — LIDOCAINE HCL (PF) 1 % IJ SOLN
INTRAMUSCULAR | Status: DC | PRN
  Administered 2020-05-11: 5 mL

## 2020-05-11 MED ORDER — SODIUM CHLORIDE 0.9% FLUSH: 3.0000 mL | INTRAVENOUS | Status: DC | PRN

## 2020-05-11 MED ORDER — SODIUM CHLORIDE 0.9 % IV SOLN: 250.0000 mL | INTRAVENOUS | Status: DC | PRN

## 2020-05-11 MED ORDER — SODIUM CHLORIDE 0.9 % IV SOLN: INTRAVENOUS | Status: DC

## 2020-05-11 MED ORDER — IODIXANOL 320 MG/ML IV SOLN
INTRAVENOUS | Status: DC | PRN
  Administered 2020-05-11: 52 mL

## 2020-05-11 MED ORDER — SODIUM CHLORIDE 0.9% FLUSH: 3.0000 mL | Freq: Two times a day (BID) | INTRAVENOUS | Status: DC

## 2020-05-11 MED ORDER — LABETALOL HCL 5 MG/ML IV SOLN: 10.0000 mg | INTRAVENOUS | Status: DC | PRN

## 2020-05-11 MED ORDER — ONDANSETRON HCL 4 MG/2ML IJ SOLN
4.0000 mg | Freq: Four times a day (QID) | INTRAMUSCULAR | Status: DC | PRN
Start: 2020-05-11 — End: 2020-05-11

## 2020-05-11 MED ORDER — HEPARIN (PORCINE) IN NACL 1000-0.9 UT/500ML-% IV SOLN
INTRAVENOUS | Status: DC | PRN
  Administered 2020-05-11 (×2): 500 mL

## 2020-05-11 MED ORDER — HYDRALAZINE HCL 20 MG/ML IJ SOLN: 5.0000 mg | INTRAMUSCULAR | Status: DC | PRN

## 2020-05-11 MED ORDER — ACETAMINOPHEN 325 MG PO TABS: 650.0000 mg | ORAL_TABLET | ORAL | Status: DC | PRN

## 2020-05-11 MED ORDER — HEPARIN (PORCINE) IN NACL 1000-0.9 UT/500ML-% IV SOLN
INTRAVENOUS | Status: AC
  Filled 2020-05-11: qty 500

## 2020-05-11 MED ORDER — LIDOCAINE HCL (PF) 1 % IJ SOLN
INTRAMUSCULAR | Status: AC
  Filled 2020-05-11: qty 30

## 2020-05-11 MED ORDER — SODIUM CHLORIDE 0.9 % WEIGHT BASED INFUSION: 1.0000 mL/kg/h | INTRAVENOUS | Status: DC

## 2020-05-11 SURGICAL SUPPLY — 9 items
KIT MICROPUNCTURE NIT STIFF (SHEATH) ×2 IMPLANT
KIT PV (KITS) ×2 IMPLANT
SHEATH PINNACLE 5F 10CM (SHEATH) ×2 IMPLANT
SHEATH PROBE COVER 6X72 (BAG) ×2 IMPLANT
STOPCOCK MORSE 400PSI 3WAY (MISCELLANEOUS) ×2 IMPLANT
SYR MEDRAD MARK V 150ML (SYRINGE) ×2 IMPLANT
TRANSDUCER W/STOPCOCK (MISCELLANEOUS) ×2 IMPLANT
TRAY PV CATH (CUSTOM PROCEDURE TRAY) ×2 IMPLANT
TUBING CIL FLEX 10 FLL-RA (TUBING) ×2 IMPLANT

## 2020-05-11 NOTE — Op Note (Signed)
DATE OF SERVICE: 05/11/2020  PATIENT:  [name]  65 y.o. male  PRE-OPERATIVE DIAGNOSIS:  Low velocities in left fem-pop bypass on surveillance duplex  POST-OPERATIVE DIAGNOSIS:  Thrombosed left fem-pop bypass  PROCEDURE:   1) US guided access of right-to-left femoral-femoral bypass graft 2) left lower extremity angiography (60mL total contrast)  SURGEON:  Surgeon(s) and Role:    * Leonie Douglas, MD - Primary  ASSISTANT: none  ANESTHESIA:   local  EBL: min  BLOOD ADMINISTERED:none  DRAINS: none   LOCAL MEDICATIONS USED:  LIDOCAINE   SPECIMEN:  none  COUNTS: confirmed correct.  TOURNIQUET:  None  PATIENT DISPOSITION:  PACU - hemodynamically stable.   Delay start of Pharmacological VTE agent (>24hrs) due to surgical blood loss or risk of bleeding: no  INDICATION FOR PROCEDURE: [name] is a 64 y.o. male status post right-to-left femoral-femoral bypass and left femoral-above knee popliteal artery bypass graft for critical limb ischemia 12/15/19.  Surveillance duplex showed significant reduction in flow of the distal femoral-popliteal bypass graft.  After careful discussion of risks, benefits, and alternatives the patient was offered angiography to evaluate the bypass graft. We specifically discussed access site complications. The patient understood and wished to proceed.  OPERATIVE FINDINGS: Robust flow through right to left femoral-femoral bypass graft.  Left femoral-popliteal bypass graft appears occluded.  There is robust flow through the left profunda artery.  The behind knee and below-knee popliteal artery reek constitute via collaterals.  There is preserved flow through the tibial trifurcation.  The anterior tibial artery and peroneal artery appear to occlude in the mid calf.  The PT artery is robust and courses to the ankle, filling the foot.  DESCRIPTION OF PROCEDURE: After identification of the patient in the pre-operative holding area, the patient was  transferred to the operating room. The patient was positioned supine on the operating room table. Anesthesia was induced. The suprapubic area was prepped and draped in standard fashion. A surgical pause was performed confirming correct patient, procedure, and operative location.  Using ultrasound guidance, the right to left femoral-femoral bypass graft was accessed with micropuncture technique.  Angiography was performed of the left groin in 2 projections, as well as the left lower extremity in stations.  The left femoral-popliteal bypass graft appears occluded.  Otherwise findings are as above.  After angiography, the sheath was removed and a figure-of-eight stitch placed at the access site.  Hemostasis was achieved.  Upon completion of the case instrument and sharps counts were confirmed correct. The patient was transferred to the PACU in good condition. I was present for all portions of the procedure.  [name]. Jordan Antu, MD [facility] Phone Number: [phone] 05/11/2020 9:44 AM

## 2020-05-11 NOTE — Interval H&P Note (Signed)
History and Physical Interval Note:  05/11/2020 7:49 AM  [name]  has presented today for surgery, with the diagnosis of pad.  The various methods of treatment have been discussed with the patient and family. After consideration of risks, benefits and other options for treatment, the patient has consented to  Procedure(s): ABDOMINAL AORTOGRAM W/LOWER EXTREMITY (N/A) as a surgical intervention.  The patient's history has been reviewed, patient examined, no change in status, stable for surgery.  I have reviewed the patient's chart and labs.  Questions were answered to the patient's satisfaction.     Leonie Douglas

## 2020-05-11 NOTE — Discharge Instructions (Signed)
Femoral Site Care  This sheet gives you information about how to care for yourself after your procedure. Your health care provider may also give you more specific instructions. If you have problems or questions, contact your health care provider. What can I expect after the procedure? After the procedure, it is common to have:  Bruising that usually fades within 1-2 weeks.  Tenderness at the site. Follow these instructions at home: Wound care  Follow instructions from your health care provider about how to take care of your insertion site. Make sure you: ? Wash your hands with soap and water before you change your bandage (dressing). If soap and water are not available, use hand sanitizer. ? Change your dressing as told by your health care provider. ? Leave stitches (sutures), skin glue, or adhesive strips in place. These skin closures may need to stay in place for 2 weeks or longer. If adhesive strip edges start to loosen and curl up, you may trim the loose edges. Do not remove adhesive strips completely unless your health care provider tells you to do that.  Do not take baths, swim, or use a hot tub until your health care provider approves.  You may shower 24-48 hours after the procedure or as told by your health care provider. ? Gently wash the site with plain soap and water. ? Pat the area dry with a clean towel. ? Do not rub the site. This may cause bleeding.  Do not apply powder or lotion to the site. Keep the site clean and dry.  Check your femoral site every day for signs of infection. Check for: ? Redness, swelling, or pain. ? Fluid or blood. ? Warmth. ? Pus or a bad smell. Activity  For the first 2-3 days after your procedure, or as long as directed: ? Avoid climbing stairs as much as possible. ? Do not squat.  Do not lift anything that is heavier than 10 lb (4.5 kg), or the limit that you are told, until your health care provider says that it is safe.  Rest as  directed. ? Avoid sitting for a long time without moving. Get up to take short walks every 1-2 hours.  Do not drive for 24 hours if you were given a medicine to help you relax (sedative). General instructions  Take over-the-counter and prescription medicines only as told by your health care provider.  Keep all follow-up visits as told by your health care provider. This is important. Contact a health care provider if you have:  A fever or chills.  You have redness, swelling, or pain around your insertion site. Get help right away if:  The catheter insertion area swells very fast.  You pass out.  You suddenly start to sweat or your skin gets clammy.  The catheter insertion area is bleeding, and the bleeding does not stop when you hold steady pressure on the area.  The area near or just beyond the catheter insertion site becomes pale, cool, tingly, or numb. These symptoms may represent a serious problem that is an emergency. Do not wait to see if the symptoms will go away. Get medical help right away. Call your local emergency services (911 in the [place] Do not drive yourself to the hospital. Summary  After the procedure, it is common to have bruising that usually fades within 1-2 weeks.  Check your femoral site every day for signs of infection.  Do not lift anything that is heavier than 10 lb (4.5 kg), or   the limit that you are told, until your health care provider says that it is safe. This information is not intended to replace advice given to you by your health care provider. Make sure you discuss any questions you have with your health care provider. Document Revised: 09/18/2019 Document Reviewed: 09/18/2019 Elsevier Patient Education  2021 [employer]  

## 2020-05-11 NOTE — Progress Notes (Signed)
Up and walked and tolerated well 

## 2020-05-17 ENCOUNTER — Other Ambulatory Visit (HOSPITAL_COMMUNITY): Payer: Medicare HMO

## 2020-05-18 ENCOUNTER — Encounter (HOSPITAL_COMMUNITY): Payer: Self-pay | Admitting: Vascular Surgery

## 2020-05-24 ENCOUNTER — Other Ambulatory Visit (HOSPITAL_COMMUNITY): Payer: Medicare HMO

## 2020-05-28 ENCOUNTER — Other Ambulatory Visit: Payer: Self-pay

## 2020-05-28 DIAGNOSIS — I70229 Atherosclerosis of native arteries of extremities with rest pain, unspecified extremity: Secondary | ICD-10-CM

## 2020-06-07 ENCOUNTER — Ambulatory Visit (INDEPENDENT_AMBULATORY_CARE_PROVIDER_SITE_OTHER): Payer: Medicare HMO | Admitting: Vascular Surgery

## 2020-06-07 ENCOUNTER — Other Ambulatory Visit: Payer: Self-pay

## 2020-06-07 ENCOUNTER — Encounter: Payer: Self-pay | Admitting: Vascular Surgery

## 2020-06-07 ENCOUNTER — Ambulatory Visit (HOSPITAL_COMMUNITY)
Admission: RE | Admit: 2020-06-07 | Discharge: 2020-06-07 | Disposition: A | Discharge status: Home / Self Care | Payer: Medicare HMO | Source: Ambulatory Visit | Attending: Vascular Surgery | Admitting: Vascular Surgery

## 2020-06-07 VITALS — BP 108/84 | HR 77 | Temp 98.4°F | Resp 20 | Ht 67.0 in | Wt 159.0 lb

## 2020-06-07 DIAGNOSIS — I739 Peripheral vascular disease, unspecified: Secondary | ICD-10-CM

## 2020-06-07 DIAGNOSIS — I70229 Atherosclerosis of native arteries of extremities with rest pain, unspecified extremity: Secondary | ICD-10-CM

## 2020-06-07 NOTE — Progress Notes (Signed)
[facility] [place] PROGRESS NOTE  ASSESSMENT / PLAN: [name] is a 65 y.o. male with complex vascular history, most recently status post femorofemoral bypass with Dacron and left thumb to above-knee popliteal bypass with greater saphenous vein.  The femoropopliteal bypass is unfortunately thrombosed.  Fortunately he has not deteriorated as a result.  He continues to have improving claudication symptoms.  He continues to abstain from smoking.  He is compliant with best medical therapy for peripheral arterial disease.  I suggest we continue to monitor his arterial disease with twice yearly ABIs.    SUBJECTIVE: No complaints.  His walking distance continues to improve.  He has no symptoms consistent with ischemic rest pain or ulceration in his lower extremities.  OBJECTIVE: BP 108/84 (BP Location: Left Arm, Patient Position: Sitting, Cuff Size: Large)   Pulse 77   Temp 98.4 F (36.9 C)   Resp 20   Ht 5\' 7"  (1.702 m)   Wt 159 lb (72.1 kg)   SpO2 100%   BMI 24.90 kg/m   Suprapubic access site clean dry and intact Femorofemoral bypass easily palpated in the suprapubic groin.  2+ pulse. Feet are warm.  No palpable pedal pulses.  CBC Latest Ref Rng & Units 05/11/2020 12/16/2019 12/15/2019  WBC 4.0 - 10.5 K/uL - 11.6(H) 7.4  Hemoglobin 13.0 - 17.0 g/dL 12/17/2019 11.5(L) 12.7(L)  Hematocrit 39.0 - 52.0 % 39.0 33.9(L) 38.1(L)  Platelets 150 - 400 K/uL - 321 336     CMP Latest Ref Rng & Units 05/11/2020 12/16/2019 12/15/2019  Glucose 70 - 99 mg/dL 12/17/2019) 458(K) -  BUN 8 - 23 mg/dL 998(P) 7(L) -  Creatinine 0.61 - 1.24 mg/dL 38(S 5.05 3.97  Sodium 135 - 145 mmol/L 138 134(L) -  Potassium 3.5 - 5.1 mmol/L 5.3(H) 3.7 -  Chloride 98 - 111 mmol/L 107 104 -  CO2 22 - 32 mmol/L - 23 -  Calcium 8.9 - 10.3 mg/dL - 8.9 -  Total Protein 6.5 - 8.1 g/dL - - -  Total Bilirubin 0.3 - 1.2 mg/dL - - -  Alkaline Phos 38 - 126 U/L - - -  AST 15 - 41 U/L - - -  ALT 0 - 44 U/L - - -     CrCl cannot be calculated (Patient's most recent lab result is older than the maximum 21 days allowed.).    6.73. Rande Brunt, MD [facility] Phone Number: [phone] 06/07/2020 5:02 PM

## 2020-06-08 ENCOUNTER — Other Ambulatory Visit: Payer: Self-pay

## 2020-06-08 DIAGNOSIS — I739 Peripheral vascular disease, unspecified: Secondary | ICD-10-CM
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
## 2020-12-09 ENCOUNTER — Ambulatory Visit (HOSPITAL_COMMUNITY)
Admission: RE | Admit: 2020-12-09 | Discharge: 2020-12-09 | Disposition: A | Discharge status: Home / Self Care | Payer: Medicare HMO | Source: Ambulatory Visit | Attending: Vascular Surgery | Admitting: Vascular Surgery

## 2020-12-09 ENCOUNTER — Other Ambulatory Visit: Payer: Self-pay

## 2020-12-09 ENCOUNTER — Ambulatory Visit (INDEPENDENT_AMBULATORY_CARE_PROVIDER_SITE_OTHER): Payer: Medicare HMO | Admitting: Physician Assistant

## 2020-12-09 VITALS — BP 129/88 | HR 69 | Temp 98.4°F | Resp 20 | Ht 67.0 in | Wt 165.3 lb

## 2020-12-09 DIAGNOSIS — I739 Peripheral vascular disease, unspecified: Secondary | ICD-10-CM

## 2020-12-09 DIAGNOSIS — F17200 Nicotine dependence, unspecified, uncomplicated: Secondary | ICD-10-CM | POA: Insufficient documentation

## 2020-12-09 DIAGNOSIS — I7090 Unspecified atherosclerosis: Secondary | ICD-10-CM | POA: Insufficient documentation

## 2020-12-09 DIAGNOSIS — I10 Essential (primary) hypertension: Secondary | ICD-10-CM | POA: Insufficient documentation

## 2020-12-09 DIAGNOSIS — E7800 Pure hypercholesterolemia, unspecified: Secondary | ICD-10-CM | POA: Insufficient documentation

## 2020-12-12 ENCOUNTER — Other Ambulatory Visit: Payer: Self-pay

## 2020-12-12 DIAGNOSIS — I739 Peripheral vascular disease, unspecified: Secondary | ICD-10-CM

## 2021-06-20 ENCOUNTER — Ambulatory Visit (INDEPENDENT_AMBULATORY_CARE_PROVIDER_SITE_OTHER)
Admission: RE | Admit: 2021-06-20 | Discharge: 2021-06-20 | Disposition: A | Discharge status: Home / Self Care | Payer: Medicare HMO | Source: Ambulatory Visit | Attending: Vascular Surgery | Admitting: Vascular Surgery

## 2021-06-20 ENCOUNTER — Ambulatory Visit (INDEPENDENT_AMBULATORY_CARE_PROVIDER_SITE_OTHER): Payer: Medicare HMO | Admitting: Physician Assistant

## 2021-06-20 ENCOUNTER — Other Ambulatory Visit: Payer: Self-pay

## 2021-06-20 ENCOUNTER — Encounter (HOSPITAL_COMMUNITY): Payer: Self-pay

## 2021-06-20 ENCOUNTER — Ambulatory Visit (HOSPITAL_COMMUNITY)
Admission: RE | Admit: 2021-06-20 | Discharge: 2021-06-20 | Disposition: A | Discharge status: Home / Self Care | Payer: Medicare HMO | Source: Ambulatory Visit | Attending: Vascular Surgery | Admitting: Vascular Surgery

## 2021-06-20 VITALS — BP 132/88 | HR 75 | Temp 97.9°F | Resp 20 | Ht 67.0 in | Wt 165.2 lb

## 2021-06-20 DIAGNOSIS — I70229 Atherosclerosis of native arteries of extremities with rest pain, unspecified extremity: Secondary | ICD-10-CM

## 2021-06-20 DIAGNOSIS — I739 Peripheral vascular disease, unspecified: Secondary | ICD-10-CM | POA: Diagnosis present

## 2021-06-20 DIAGNOSIS — Z95828 Presence of other vascular implants and grafts: Secondary | ICD-10-CM | POA: Diagnosis not present

## 2021-06-20 DIAGNOSIS — Z9862 Peripheral vascular angioplasty status: Secondary | ICD-10-CM
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
## 2021-06-23 ENCOUNTER — Other Ambulatory Visit: Payer: Self-pay | Admitting: *Deleted

## 2021-06-23 DIAGNOSIS — Z9862 Peripheral vascular angioplasty status: Secondary | ICD-10-CM

## 2021-06-23 DIAGNOSIS — I739 Peripheral vascular disease, unspecified: Secondary | ICD-10-CM

## 2022-03-12 ENCOUNTER — Other Ambulatory Visit: Payer: Self-pay | Admitting: *Deleted

## 2022-03-12 DIAGNOSIS — I739 Peripheral vascular disease, unspecified: Secondary | ICD-10-CM

## 2022-03-12 DIAGNOSIS — I70229 Atherosclerosis of native arteries of extremities with rest pain, unspecified extremity: Secondary | ICD-10-CM

## 2022-03-19 ENCOUNTER — Ambulatory Visit (HOSPITAL_COMMUNITY)
Admission: RE | Admit: 2022-03-19 | Discharge: 2022-03-19 | Disposition: A | Discharge status: Home / Self Care | Payer: Medicare HMO | Source: Ambulatory Visit | Attending: Vascular Surgery | Admitting: Vascular Surgery

## 2022-03-19 ENCOUNTER — Ambulatory Visit (INDEPENDENT_AMBULATORY_CARE_PROVIDER_SITE_OTHER): Payer: Medicare HMO | Admitting: Physician Assistant

## 2022-03-19 VITALS — BP 135/97 | HR 98 | Temp 98.2°F | Resp 20 | Ht 67.0 in | Wt 165.1 lb

## 2022-03-19 DIAGNOSIS — I739 Peripheral vascular disease, unspecified: Secondary | ICD-10-CM

## 2022-03-19 DIAGNOSIS — Z95828 Presence of other vascular implants and grafts: Secondary | ICD-10-CM | POA: Diagnosis not present

## 2022-03-19 DIAGNOSIS — I70229 Atherosclerosis of native arteries of extremities with rest pain, unspecified extremity: Secondary | ICD-10-CM | POA: Insufficient documentation
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
## 2022-03-20 ENCOUNTER — Other Ambulatory Visit: Payer: Self-pay

## 2022-03-20 DIAGNOSIS — I739 Peripheral vascular disease, unspecified: Secondary | ICD-10-CM

## 2022-03-20 DIAGNOSIS — Z95828 Presence of other vascular implants and grafts: Secondary | ICD-10-CM

## 2022-03-20 LAB — VAS US ABI WITH/WO TBI
Left ABI: 0.55
Right ABI: 0.69

## 2022-03-22 ENCOUNTER — Telehealth: Payer: Self-pay

## 2022-03-22 NOTE — Telephone Encounter (Signed)
I called pt and left a message requesting a return phone call about pending surgery authorization status.

## 2022-03-22 NOTE — Telephone Encounter (Signed)
-----   Message from Cherre Robins, MD sent at 03/22/2022  3:16 PM EST ----- Regarding: Angiogram tomorrow needs to be changed Patient needs a CT angiogram of abdomen and pelvis with runoff to toes. He does not need invasive angiogram with me tomorrow. Please cancel this and set up for CTA. This should be done ASAP. I will call him with the results and to plan an intervention. Thanks! Gershon Mussel

## 2022-03-22 NOTE — Telephone Encounter (Signed)
Patient called back and agreed to proceed with his Angiogram scheduled for 03/22/21.  Pt Verbalized understanding that he does not have an authorizaiton from [facility] yet.

## 2022-03-22 NOTE — Telephone Encounter (Signed)
Informed patient of recommendations per Dr. Stanford Breed. Patient was agreeable to proceed with CT angiogram and aware that someone will be contacting him to arrange appt. Aortogram procedure for tomorrow cancelled.

## 2022-03-23 ENCOUNTER — Ambulatory Visit (HOSPITAL_COMMUNITY): Admission: RE | Admit: 2022-03-23 | Payer: Medicare HMO | Source: Home / Self Care | Admitting: Vascular Surgery

## 2022-03-23 ENCOUNTER — Encounter (HOSPITAL_COMMUNITY): Admission: RE | Payer: Self-pay | Source: Home / Self Care

## 2022-03-23 ENCOUNTER — Other Ambulatory Visit: Payer: Self-pay

## 2022-03-23 DIAGNOSIS — I739 Peripheral vascular disease, unspecified: Secondary | ICD-10-CM

## 2022-03-23 SURGERY — ABDOMINAL AORTOGRAM W/LOWER EXTREMITY
Anesthesia: LOCAL

## 2022-03-23 NOTE — Addendum Note (Signed)
Addended by: Kaleen Mask on: 03/23/2022 02:25 PM   Modules accepted: Orders

## 2022-03-30 ENCOUNTER — Telehealth: Payer: Self-pay

## 2022-03-30 ENCOUNTER — Encounter (HOSPITAL_COMMUNITY): Payer: Self-pay

## 2022-03-30 ENCOUNTER — Ambulatory Visit (HOSPITAL_COMMUNITY)
Admission: RE | Admit: 2022-03-30 | Discharge: 2022-03-30 | Disposition: A | Discharge status: Home / Self Care | Payer: Medicare HMO | Source: Ambulatory Visit | Attending: Vascular Surgery | Admitting: Vascular Surgery

## 2022-03-30 DIAGNOSIS — I739 Peripheral vascular disease, unspecified: Secondary | ICD-10-CM | POA: Diagnosis not present

## 2022-03-30 DIAGNOSIS — I743 Embolism and thrombosis of arteries of the lower extremities: Secondary | ICD-10-CM | POA: Diagnosis not present

## 2022-03-30 DIAGNOSIS — I745 Embolism and thrombosis of iliac artery: Secondary | ICD-10-CM | POA: Diagnosis not present

## 2022-03-30 MED ORDER — SODIUM CHLORIDE (PF) 0.9 % IJ SOLN
INTRAMUSCULAR | Status: AC
  Filled 2022-03-30: qty 50

## 2022-03-30 MED ORDER — IOHEXOL 350 MG/ML SOLN
100.0000 mL | Freq: Once | INTRAVENOUS | Status: AC | PRN
  Administered 2022-03-30: 100 mL via INTRAVENOUS

## 2022-03-30 NOTE — Telephone Encounter (Signed)
[facility] Rad[facility] with critical findings on CT.  Ensured CT results in chart, sent staff msg to Dr. Stanford Breed, called [facility] and confirmed receipt. Confirmed understanding.
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
## 2022-04-03 ENCOUNTER — Encounter: Payer: Self-pay | Admitting: Vascular Surgery

## 2022-04-03 ENCOUNTER — Ambulatory Visit (INDEPENDENT_AMBULATORY_CARE_PROVIDER_SITE_OTHER): Payer: Medicare HMO | Admitting: Vascular Surgery

## 2022-04-03 VITALS — BP 120/84 | HR 84 | Temp 99.0°F | Resp 20 | Ht 67.0 in | Wt 165.0 lb

## 2022-04-03 DIAGNOSIS — I7409 Other arterial embolism and thrombosis of abdominal aorta: Secondary | ICD-10-CM | POA: Diagnosis not present

## 2022-04-03 DIAGNOSIS — I739 Peripheral vascular disease, unspecified: Secondary | ICD-10-CM | POA: Diagnosis not present

## 2022-04-04 ENCOUNTER — Other Ambulatory Visit: Payer: Self-pay

## 2022-04-04 DIAGNOSIS — I739 Peripheral vascular disease, unspecified: Secondary | ICD-10-CM

## 2022-04-04 DIAGNOSIS — I7409 Other arterial embolism and thrombosis of abdominal aorta: Secondary | ICD-10-CM

## 2022-04-06 ENCOUNTER — Telehealth: Payer: Self-pay | Admitting: *Deleted

## 2022-04-06 NOTE — Telephone Encounter (Signed)
Helene Kelp, RN [facility] preadmissions called inquiring if pletal is to be held prior to surgery Monday, femoral endarterectomy by Dr Stanford Breed.  I spoke to Dr Virl Cagey and he confirmed hold.  Helene Kelp voiced acknowledgement.

## 2022-04-06 NOTE — Progress Notes (Signed)
Surgical Instructions   Your procedure is scheduled on Thursday, 04/12/22 at 10:30 AM.   Report to [facility] Main Entrance "A" at 8:30 AM, then check in with the Admitting office.   Call this number if you have problems the morning of surgery:  [phone]   If you have any questions prior to your surgery date call [phone]: Open Monday-Friday 8am-4pm If you experience any cold or flu symptoms such as cough, fever, chills, shortness of breath, etc. between now and your scheduled surgery, please notify us at the above number    Remember:  Do not eat or drink after midnight the night before your surgery.   Take these medicines the morning of surgery with A SIP OF WATER: Allopurinol (Zyloprim), Atorvastatin (Lipitor), Bupropion (Wellbutrin), Docusate Sodium (Colace).    As of today, STOP Cilostazol (Pletal), Aleve, Naproxen, Ibuprofen, Motrin, Advil, Goody's, BC's, all herbal medications, fish oil and all vitamins.       Do not wear jewelry. Do not shave 48 hours prior to surgery.  Men may shave face and neck. Do not bring valuables to the hospital.  [facility] is not responsible for any belongings or valuables.     Contacts, glasses, hearing aids, dentures or partials may not be worn into surgery, please bring cases for these belongings   For patients admitted to the hospital, discharge time will be determined by your treatment team.  [facility] VISITATION Patients having surgery or a procedure may have no more than 2 support people in the waiting area - these visitors may rotate.   Children under the age of 54 must have an adult with them who is not the patient. If the patient needs to stay at the hospital during part of their recovery, the visitor guidelines for inpatient rooms apply. Pre-op nurse will coordinate an appropriate time for 1 support person to accompany patient in pre-op.  This support person may not rotate.   Please refer to  [email or web address] for the visitor guidelines for Inpatients (after your surgery is over and you are in a regular room).   Special instructions:    Oral Hygiene is also important to reduce your risk of infection.  Remember - BRUSH YOUR TEETH THE MORNING OF SURGERY WITH YOUR REGULAR TOOTHPASTE  [place]- Preparing For Surgery  Before surgery, you can play an important role. Because skin is not sterile, your skin needs to be as free of germs as possible. You can reduce the number of germs on your skin by washing with CHG (chlorahexidine gluconate) Soap before surgery.  CHG is an antiseptic cleaner which kills germs and bonds with the skin to continue killing germs even after washing.    Please do not use if you have an allergy to CHG or antibacterial soaps. If your skin becomes reddened/irritated stop using the CHG.  Do not shave (including legs and underarms) for at least 48 hours prior to first CHG shower. It is OK to shave your face.  Please follow these instructions carefully.    Shower the NIGHT BEFORE SURGERY and the MORNING OF SURGERY with CHG Soap.   If you chose to wash your hair, wash your hair first as usual with your normal shampoo. After you shampoo, rinse your hair and body thoroughly to remove the shampoo.  Then [employer] and genitals (private parts) with your normal soap and rinse thoroughly to remove soap.  After that Use CHG Soap as you would any other liquid soap. You can apply  CHG directly to the skin and wash gently with a scrungie or a clean washcloth.   Apply the CHG Soap to your body ONLY FROM THE NECK DOWN.  Do not use on open wounds or open sores. Avoid contact with your eyes, ears, mouth and genitals (private parts). Wash Face and genitals (private parts)  with your normal soap.   Wash thoroughly, paying special attention to the area where your surgery will be performed.  Thoroughly rinse your body with warm water  from the neck down.  DO NOT shower/wash with your normal soap after using and rinsing off the CHG Soap.  Pat yourself dry with a CLEAN TOWEL.  Wear CLEAN PAJAMAS to bed the night before surgery  Place CLEAN SHEETS on your bed the night before your surgery  DO NOT SLEEP WITH PETS.  Day of Surgery: Take a shower with CHG soap. Wear Clean/Comfortable clothing the morning of surgery Do not apply lotions, powders, cologne or deodorant. Remember to brush your teeth WITH YOUR REGULAR TOOTHPASTE.  Please read over the fact sheets that you were given.

## 2022-04-09 ENCOUNTER — Encounter (HOSPITAL_COMMUNITY): Payer: Self-pay

## 2022-04-09 ENCOUNTER — Other Ambulatory Visit: Payer: Self-pay

## 2022-04-09 ENCOUNTER — Encounter (HOSPITAL_COMMUNITY)
Admission: RE | Admit: 2022-04-09 | Discharge: 2022-04-09 | Disposition: A | Discharge status: Home / Self Care | Payer: Medicare HMO | Source: Ambulatory Visit | Attending: Vascular Surgery | Admitting: Vascular Surgery

## 2022-04-09 VITALS — BP 133/93 | HR 75 | Temp 98.3°F | Resp 18 | Ht 67.0 in | Wt 167.8 lb

## 2022-04-09 DIAGNOSIS — I7409 Other arterial embolism and thrombosis of abdominal aorta: Secondary | ICD-10-CM | POA: Insufficient documentation

## 2022-04-09 DIAGNOSIS — Z01818 Encounter for other preprocedural examination: Secondary | ICD-10-CM | POA: Diagnosis not present

## 2022-04-09 DIAGNOSIS — I739 Peripheral vascular disease, unspecified: Secondary | ICD-10-CM | POA: Diagnosis not present

## 2022-04-09 LAB — APTT: aPTT: 25 seconds (ref 24–36)

## 2022-04-09 LAB — CBC
HCT: 43.3 % (ref 39.0–52.0)
Hemoglobin: 13.8 g/dL (ref 13.0–17.0)
MCH: 29.7 pg (ref 26.0–34.0)
MCHC: 31.9 g/dL (ref 30.0–36.0)
MCV: 93.3 fL (ref 80.0–100.0)
Platelets: 318 10*3/uL (ref 150–400)
RBC: 4.64 MIL/uL (ref 4.22–5.81)
RDW: 13.6 % (ref 11.5–15.5)
WBC: 4.1 10*3/uL (ref 4.0–10.5)
nRBC: 0 % (ref 0.0–0.2)

## 2022-04-09 LAB — URINALYSIS, ROUTINE W REFLEX MICROSCOPIC
Bilirubin Urine: NEGATIVE
Glucose, UA: NEGATIVE mg/dL
Hgb urine dipstick: NEGATIVE
Ketones, ur: NEGATIVE mg/dL
Leukocytes,Ua: NEGATIVE
Nitrite: NEGATIVE
Protein, ur: NEGATIVE mg/dL
Specific Gravity, Urine: 1.019 (ref 1.005–1.030)
pH: 5 (ref 5.0–8.0)

## 2022-04-09 LAB — COMPREHENSIVE METABOLIC PANEL
ALT: 69 U/L — ABNORMAL HIGH (ref 0–44)
AST: 42 U/L — ABNORMAL HIGH (ref 15–41)
Albumin: 3.6 g/dL (ref 3.5–5.0)
Alkaline Phosphatase: 74 U/L (ref 38–126)
Anion gap: 8 (ref 5–15)
BUN: 12 mg/dL (ref 8–23)
CO2: 27 mmol/L (ref 22–32)
Calcium: 9.6 mg/dL (ref 8.9–10.3)
Chloride: 102 mmol/L (ref 98–111)
Creatinine, Ser: 1.1 mg/dL (ref 0.61–1.24)
GFR, Estimated: >60 mL/min (ref >60)
Glucose, Bld: 121 mg/dL — ABNORMAL HIGH (ref 70–99)
Potassium: 3.7 mmol/L (ref 3.5–5.1)
Sodium: 137 mmol/L (ref 135–145)
Total Bilirubin: 0.7 mg/dL (ref 0.3–1.2)
Total Protein: 6.2 g/dL — ABNORMAL LOW (ref 6.5–8.1)

## 2022-04-09 LAB — TYPE AND SCREEN
ABO/RH(D): O POS
Antibody Screen: NEGATIVE

## 2022-04-09 LAB — SURGICAL PCR SCREEN
MRSA, PCR: NEGATIVE
Staphylococcus aureus: NEGATIVE

## 2022-04-09 LAB — PROTIME-INR
INR: 1 (ref 0.8–1.2)
Prothrombin Time: 13 seconds (ref 11.4–15.2)

## 2022-04-09 NOTE — Progress Notes (Signed)
PCP - Dr. Dustin Folks, [facility]. Health Surgeon - Dr. Stanford Breed  EKG - today  Blood Thinner Instructions: Denies Aspirin Instructions: Denies    Anesthesia review: No  Patient denies shortness of breath, fever, cough and chest pain at PAT appointment   All instructions explained to the patient, with a verbal understanding of the material. Patient agrees to go over the instructions while at home for a better understanding. The opportunity to ask questions was provided.

## 2022-04-12 ENCOUNTER — Ambulatory Visit (HOSPITAL_COMMUNITY)
Admission: RE | Admit: 2022-04-12 | Discharge: 2022-04-12 | Disposition: A | Discharge status: Home / Self Care | Payer: Medicare HMO | Source: Ambulatory Visit | Attending: Vascular Surgery | Admitting: Vascular Surgery

## 2022-04-12 ENCOUNTER — Inpatient Hospital Stay (HOSPITAL_COMMUNITY): Payer: Medicare HMO | Admitting: Registered Nurse

## 2022-04-12 ENCOUNTER — Encounter (HOSPITAL_COMMUNITY)
Admission: RE | Disposition: A | Discharge status: Home / Self Care | Payer: Self-pay | Source: Ambulatory Visit | Attending: Vascular Surgery

## 2022-04-12 ENCOUNTER — Other Ambulatory Visit: Payer: Self-pay

## 2022-04-12 ENCOUNTER — Inpatient Hospital Stay (HOSPITAL_COMMUNITY): Payer: Medicare HMO

## 2022-04-12 ENCOUNTER — Encounter (HOSPITAL_COMMUNITY): Payer: Self-pay | Admitting: Vascular Surgery

## 2022-04-12 DIAGNOSIS — I70212 Atherosclerosis of native arteries of extremities with intermittent claudication, left leg: Secondary | ICD-10-CM | POA: Insufficient documentation

## 2022-04-12 DIAGNOSIS — I10 Essential (primary) hypertension: Secondary | ICD-10-CM | POA: Insufficient documentation

## 2022-04-12 DIAGNOSIS — I70222 Atherosclerosis of native arteries of extremities with rest pain, left leg: Secondary | ICD-10-CM | POA: Diagnosis not present

## 2022-04-12 DIAGNOSIS — I708 Atherosclerosis of other arteries: Secondary | ICD-10-CM | POA: Diagnosis not present

## 2022-04-12 DIAGNOSIS — I739 Peripheral vascular disease, unspecified: Secondary | ICD-10-CM

## 2022-04-12 DIAGNOSIS — I7409 Other arterial embolism and thrombosis of abdominal aorta: Secondary | ICD-10-CM

## 2022-04-12 DIAGNOSIS — Z87891 Personal history of nicotine dependence: Secondary | ICD-10-CM | POA: Diagnosis not present

## 2022-04-12 DIAGNOSIS — Z79899 Other long term (current) drug therapy: Secondary | ICD-10-CM | POA: Insufficient documentation

## 2022-04-12 HISTORY — PX: AORTOGRAM: SHX6300

## 2022-04-12 HISTORY — PX: ULTRASOUND GUIDANCE FOR VASCULAR ACCESS: SHX6516

## 2022-04-12 HISTORY — PX: INSERTION OF ILIAC STENT: SHX6256

## 2022-04-12 LAB — POCT ACTIVATED CLOTTING TIME
Activated Clotting Time: 228 seconds
Activated Clotting Time: 239 seconds

## 2022-04-12 SURGERY — INSERTION, STENT, ARTERY, ILIAC
Anesthesia: General | Laterality: Left

## 2022-04-12 MED ORDER — HEPARIN 6000 UNIT IRRIGATION SOLUTION
Status: DC | PRN
  Administered 2022-04-12: 1

## 2022-04-12 MED ORDER — ACETAMINOPHEN 10 MG/ML IV SOLN
1000.0000 mg | Freq: Once | INTRAVENOUS | Status: DC | PRN
  Administered 2022-04-12: 1000 mg via INTRAVENOUS

## 2022-04-12 MED ORDER — PHENYLEPHRINE 80 MCG/ML (10ML) SYRINGE FOR IV PUSH (FOR BLOOD PRESSURE SUPPORT)
PREFILLED_SYRINGE | INTRAVENOUS | Status: DC | PRN
  Administered 2022-04-12 (×3): 80 ug via INTRAVENOUS

## 2022-04-12 MED ORDER — SUGAMMADEX SODIUM 200 MG/2ML IV SOLN
INTRAVENOUS | Status: DC | PRN
  Administered 2022-04-12 (×2): 100 mg via INTRAVENOUS

## 2022-04-12 MED ORDER — CLOPIDOGREL BISULFATE 75 MG PO TABS: 75.0000 mg | ORAL_TABLET | Freq: Every day | ORAL | 11 refills | Status: DC

## 2022-04-12 MED ORDER — PROPOFOL 10 MG/ML IV BOLUS
INTRAVENOUS | Status: AC
  Filled 2022-04-12: qty 20

## 2022-04-12 MED ORDER — ORAL CARE MOUTH RINSE: 15.0000 mL | Freq: Once | OROMUCOSAL | Status: AC

## 2022-04-12 MED ORDER — ASPIRIN 81 MG PO TBEC: 81.0000 mg | DELAYED_RELEASE_TABLET | Freq: Every day | ORAL | 11 refills | Status: DC

## 2022-04-12 MED ORDER — FENTANYL CITRATE (PF) 100 MCG/2ML IJ SOLN
INTRAMUSCULAR | Status: AC
  Filled 2022-04-12: qty 2

## 2022-04-12 MED ORDER — SODIUM CHLORIDE 0.9 % IV SOLN: INTRAVENOUS | Status: DC

## 2022-04-12 MED ORDER — CLOPIDOGREL BISULFATE 300 MG PO TABS
300.0000 mg | ORAL_TABLET | Freq: Once | ORAL | Status: AC
  Administered 2022-04-12: 300 mg via ORAL
  Filled 2022-04-12 (×2): qty 1

## 2022-04-12 MED ORDER — ROCURONIUM BROMIDE 10 MG/ML (PF) SYRINGE
PREFILLED_SYRINGE | INTRAVENOUS | Status: DC | PRN
  Administered 2022-04-12 (×2): 20 mg via INTRAVENOUS
  Administered 2022-04-12: 60 mg via INTRAVENOUS

## 2022-04-12 MED ORDER — PROPOFOL 10 MG/ML IV BOLUS
INTRAVENOUS | Status: DC | PRN
  Administered 2022-04-12: 200 mg via INTRAVENOUS

## 2022-04-12 MED ORDER — HEPARIN 6000 UNIT IRRIGATION SOLUTION
Status: AC
  Filled 2022-04-12: qty 500

## 2022-04-12 MED ORDER — OXYCODONE HCL 5 MG PO TABS: 5.0000 mg | ORAL_TABLET | Freq: Once | ORAL | Status: DC | PRN

## 2022-04-12 MED ORDER — PHENYLEPHRINE HCL-NACL 20-0.9 MG/250ML-% IV SOLN
INTRAVENOUS | Status: DC | PRN
  Administered 2022-04-12: 50 ug/min via INTRAVENOUS

## 2022-04-12 MED ORDER — MIDAZOLAM HCL 2 MG/2ML IJ SOLN
INTRAMUSCULAR | Status: DC | PRN
  Administered 2022-04-12: 2 mg via INTRAVENOUS

## 2022-04-12 MED ORDER — CEFAZOLIN SODIUM-DEXTROSE 2-4 GM/100ML-% IV SOLN
2.0000 g | INTRAVENOUS | Status: AC
  Administered 2022-04-12: 2 g via INTRAVENOUS
  Filled 2022-04-12: qty 100

## 2022-04-12 MED ORDER — PROMETHAZINE HCL 25 MG/ML IJ SOLN: 6.2500 mg | INTRAMUSCULAR | Status: DC | PRN

## 2022-04-12 MED ORDER — ACETAMINOPHEN 160 MG/5ML PO SOLN: 325.0000 mg | ORAL | Status: DC | PRN

## 2022-04-12 MED ORDER — AMISULPRIDE (ANTIEMETIC) 5 MG/2ML IV SOLN: 10.0000 mg | Freq: Once | INTRAVENOUS | Status: DC | PRN

## 2022-04-12 MED ORDER — CHLORHEXIDINE GLUCONATE CLOTH 2 % EX PADS: 6.0000 | MEDICATED_PAD | Freq: Once | CUTANEOUS | Status: DC

## 2022-04-12 MED ORDER — MIDAZOLAM HCL 2 MG/2ML IJ SOLN
INTRAMUSCULAR | Status: AC
  Filled 2022-04-12: qty 2

## 2022-04-12 MED ORDER — ACETAMINOPHEN 325 MG PO TABS: 325.0000 mg | ORAL_TABLET | ORAL | Status: DC | PRN

## 2022-04-12 MED ORDER — ONDANSETRON HCL 4 MG/2ML IJ SOLN
INTRAMUSCULAR | Status: DC | PRN
  Administered 2022-04-12: 4 mg via INTRAVENOUS

## 2022-04-12 MED ORDER — LIDOCAINE 2% (20 MG/ML) 5 ML SYRINGE
INTRAMUSCULAR | Status: DC | PRN
  Administered 2022-04-12: 40 mg via INTRAVENOUS

## 2022-04-12 MED ORDER — IODIXANOL 320 MG/ML IV SOLN
INTRAVENOUS | Status: DC | PRN
  Administered 2022-04-12: 142 mL

## 2022-04-12 MED ORDER — 0.9 % SODIUM CHLORIDE (POUR BTL) OPTIME
TOPICAL | Status: DC | PRN
  Administered 2022-04-12: 2000 mL

## 2022-04-12 MED ORDER — OXYCODONE HCL 5 MG/5ML PO SOLN: 5.0000 mg | Freq: Once | ORAL | Status: DC | PRN

## 2022-04-12 MED ORDER — CHLORHEXIDINE GLUCONATE 0.12 % MT SOLN
15.0000 mL | Freq: Once | OROMUCOSAL | Status: AC
  Administered 2022-04-12: 15 mL via OROMUCOSAL
  Filled 2022-04-12: qty 15

## 2022-04-12 MED ORDER — FENTANYL CITRATE (PF) 100 MCG/2ML IJ SOLN
25.0000 ug | INTRAMUSCULAR | Status: DC | PRN
  Administered 2022-04-12 (×2): 50 ug via INTRAVENOUS

## 2022-04-12 MED ORDER — FENTANYL CITRATE (PF) 250 MCG/5ML IJ SOLN
INTRAMUSCULAR | Status: AC
  Filled 2022-04-12: qty 5

## 2022-04-12 MED ORDER — ACETAMINOPHEN 10 MG/ML IV SOLN
INTRAVENOUS | Status: AC
  Filled 2022-04-12: qty 100

## 2022-04-12 MED ORDER — HEPARIN SODIUM (PORCINE) 1000 UNIT/ML IJ SOLN
INTRAMUSCULAR | Status: DC | PRN
  Administered 2022-04-12: 2000 [IU] via INTRAVENOUS
  Administered 2022-04-12: 7500 [IU] via INTRAVENOUS

## 2022-04-12 MED ORDER — DEXAMETHASONE SODIUM PHOSPHATE 10 MG/ML IJ SOLN
INTRAMUSCULAR | Status: DC | PRN
  Administered 2022-04-12: 5 mg via INTRAVENOUS

## 2022-04-12 MED ORDER — ASPIRIN 325 MG PO TABS
325.0000 mg | ORAL_TABLET | Freq: Once | ORAL | Status: AC
  Administered 2022-04-12: 325 mg via ORAL
  Filled 2022-04-12 (×2): qty 1

## 2022-04-12 MED ORDER — LACTATED RINGERS IV SOLN: INTRAVENOUS | Status: DC | PRN

## 2022-04-12 MED ORDER — FENTANYL CITRATE (PF) 250 MCG/5ML IJ SOLN
INTRAMUSCULAR | Status: DC | PRN
  Administered 2022-04-12: 50 ug via INTRAVENOUS
  Administered 2022-04-12 (×2): 75 ug via INTRAVENOUS

## 2022-04-12 MED ORDER — LACTATED RINGERS IV SOLN: INTRAVENOUS | Status: DC

## 2022-04-12 SURGICAL SUPPLY — 83 items
ADH SKN CLS APL DERMABOND .7 (GAUZE/BANDAGES/DRESSINGS) ×2
APL PRP STRL LF DISP 70% ISPRP (MISCELLANEOUS) ×2
APL SKNCLS STERI-STRIP NONHPOA (GAUZE/BANDAGES/DRESSINGS) ×2
BAG COUNTER SPONGE SURGICOUNT (BAG) ×2 IMPLANT
BAG SNAP BAND KOVER 36X36 (MISCELLANEOUS) IMPLANT
BAG SPNG CNTER NS LX DISP (BAG) ×2
BALLN MUSTANG 7X60X75 (BALLOONS) ×2
BALLN MUSTANG 8.0X40 75 (BALLOONS) ×2
BALLN MUSTANG 8X80X75 (BALLOONS) ×2
BALLOON MUSTANG 7X60X75 (BALLOONS) IMPLANT
BALLOON MUSTANG 8.0X40 75 (BALLOONS) IMPLANT
BALLOON MUSTANG 8X80X75 (BALLOONS) IMPLANT
BENZOIN TINCTURE PRP APPL 2/3 (GAUZE/BANDAGES/DRESSINGS) ×2 IMPLANT
CANISTER SUCT 3000ML PPV (MISCELLANEOUS) ×2 IMPLANT
CANNULA VESSEL 3MM 2 BLNT TIP (CANNULA) ×4 IMPLANT
CATH BEACON 5 .035 65 KMP TIP (CATHETERS) IMPLANT
CATH OMNI FLUSH 5F 65CM (CATHETERS) ×2 IMPLANT
CATH QUICKCROSS SUPP .035X90CM (MICROCATHETER) IMPLANT
CHLORAPREP W/TINT 26 (MISCELLANEOUS) ×2 IMPLANT
CLIP LIGATING EXTRA MED SLVR (CLIP) IMPLANT
CLIP LIGATING EXTRA SM BLUE (MISCELLANEOUS) IMPLANT
CLOSURE PERCLOSE PROSTYLE (VASCULAR PRODUCTS) IMPLANT
CNTNR URN SCR LID CUP LEK RST (MISCELLANEOUS) IMPLANT
CONT SPEC 4OZ STRL OR WHT (MISCELLANEOUS) ×2
COVER PROBE W GEL 5X96 (DRAPES) IMPLANT
DERMABOND ADVANCED .7 DNX12 (GAUZE/BANDAGES/DRESSINGS) IMPLANT
DRAIN CHANNEL 15F RND FF W/TCR (WOUND CARE) IMPLANT
DRAPE C-ARM 42X72 X-RAY (DRAPES) IMPLANT
DRAPE INCISE IOBAN 66X45 STRL (DRAPES) IMPLANT
ELECT REM PT RETURN 9FT ADLT (ELECTROSURGICAL) ×2
ELECTRODE REM PT RTRN 9FT ADLT (ELECTROSURGICAL) ×2 IMPLANT
EVACUATOR SILICONE 100CC (DRAIN) IMPLANT
GAUZE SPONGE 4X4 12PLY STRL (GAUZE/BANDAGES/DRESSINGS) ×2 IMPLANT
GLIDEWIRE ADV .035X180CM (WIRE) IMPLANT
GLOVE BIO SURGEON STRL SZ8 (GLOVE) ×2 IMPLANT
GLOVE BIOGEL PI IND STRL 6.5 (GLOVE) IMPLANT
GOWN STRL REUS W/ TWL LRG LVL3 (GOWN DISPOSABLE) ×4 IMPLANT
GOWN STRL REUS W/ TWL XL LVL3 (GOWN DISPOSABLE) ×2 IMPLANT
GOWN STRL REUS W/TWL LRG LVL3 (GOWN DISPOSABLE) ×4
GOWN STRL REUS W/TWL XL LVL3 (GOWN DISPOSABLE) ×2
KIT BASIN OR (CUSTOM PROCEDURE TRAY) ×2 IMPLANT
KIT ENCORE 26 ADVANTAGE (KITS) IMPLANT
KIT MICROPUNCTURE NIT STIFF (SHEATH) IMPLANT
KIT TURNOVER KIT B (KITS) ×2 IMPLANT
NS IRRIG 1000ML POUR BTL (IV SOLUTION) ×4 IMPLANT
PACK ENDO MINOR (CUSTOM PROCEDURE TRAY) ×2 IMPLANT
PACK PERIPHERAL VASCULAR (CUSTOM PROCEDURE TRAY) ×2 IMPLANT
PAD ARMBOARD 7.5X6 YLW CONV (MISCELLANEOUS) ×4 IMPLANT
PENCIL SMOKE EVACUATOR (MISCELLANEOUS) IMPLANT
SET MICROPUNCTURE 5F STIFF (MISCELLANEOUS) ×2 IMPLANT
SET WALTER ACTIVATION W/DRAPE (SET/KITS/TRAYS/PACK) ×2 IMPLANT
SHEATH BRITE TIP 7FRX11 (SHEATH) IMPLANT
SHEATH GUIDING 7F 55X73X9MM TD (SHEATH) IMPLANT
SHEATH PINNACLE 5F 10CM (SHEATH) ×2 IMPLANT
SHEATH PINNACLE 8F 10CM (SHEATH) ×2 IMPLANT
STENT INNOVA 8X60X130 (Permanent Stent) IMPLANT
STENT VIABAHN 8X50X120 (Permanent Stent) ×2 IMPLANT
STENT VIABAHN 9X5X120 (Permanent Stent) IMPLANT
STENT VIABAHN5X120X8FR 8X (Permanent Stent) IMPLANT
STENT VIABAHNBX 10X59X135 (Permanent Stent) IMPLANT
STOPCOCK MORSE 400PSI 3WAY (MISCELLANEOUS) ×2 IMPLANT
STRIP CLOSURE SKIN 1/2X4 (GAUZE/BANDAGES/DRESSINGS) ×4 IMPLANT
SUT ETHILON 3 0 PS 1 (SUTURE) IMPLANT
SUT MNCRL AB 4-0 PS2 18 (SUTURE) ×2 IMPLANT
SUT PROLENE 5 0 C 1 24 (SUTURE) ×2 IMPLANT
SUT PROLENE 6 0 BV (SUTURE) ×2 IMPLANT
SUT VIC AB 2-0 CT1 27 (SUTURE) ×2
SUT VIC AB 2-0 CT1 TAPERPNT 27 (SUTURE) ×2 IMPLANT
SUT VIC AB 3-0 SH 27 (SUTURE) ×2
SUT VIC AB 3-0 SH 27X BRD (SUTURE) ×2 IMPLANT
SYR 10ML LL (SYRINGE) IMPLANT
SYR MEDRAD MARK V 150ML (SYRINGE) ×2 IMPLANT
TOWEL GREEN STERILE (TOWEL DISPOSABLE) ×4 IMPLANT
TOWEL GREEN STERILE FF (TOWEL DISPOSABLE) ×2 IMPLANT
TUBING HIGH PRESSURE 120CM (CONNECTOR) ×2 IMPLANT
TUBING INJECTOR 48 (MISCELLANEOUS) IMPLANT
UNDERPAD 30X36 HEAVY ABSORB (UNDERPADS AND DIAPERS) ×2 IMPLANT
WATER STERILE IRR 1000ML POUR (IV SOLUTION) ×2 IMPLANT
WIRE AMPLATZ SS-J .035X180CM (WIRE) IMPLANT
WIRE AMPLATZ SS-J .035X260CM (WIRE) IMPLANT
WIRE BENTSON .035X145CM (WIRE) ×2 IMPLANT
WIRE ROSEN-J .035X260CM (WIRE) IMPLANT
WIRE STARTER BENTSON 035X150 (WIRE) IMPLANT

## 2022-04-12 NOTE — Transfer of Care (Signed)
Immediate Anesthesia Transfer of Care Note  Patient: [name]  Procedure(s) Performed: LEFT COMMON AND EXTERNIAL ILIAC INSERTION OF STENT (Left) ULTRASOUND GUIDANCE FOR VASCULAR ACCESS AORTOGRAM  Patient Location: PACU  Anesthesia Type:General  Level of Consciousness: awake, alert , and oriented  Airway & Oxygen Therapy: Patient Spontanous Breathing and Patient connected to nasal cannula oxygen  Post-op Assessment: Report given to RN and Post -op Vital signs reviewed and stable  Post vital signs: Reviewed and stable  Last Vitals:  Vitals Value Taken Time  BP 164/110 04/12/22 1332  Temp    Pulse 84 04/12/22 1334  Resp 16 04/12/22 1334  SpO2 97 % 04/12/22 1334  Vitals shown include unvalidated device data.  Last Pain:  Vitals:   04/12/22 0808  TempSrc:   PainSc: 0-No pain         Complications: No notable events documented.

## 2022-04-12 NOTE — Op Note (Signed)
DATE OF SERVICE: 04/12/2022  PATIENT:  [name]  67 y.o. 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  PATIENT DISPOSITION:  PACU - hemodynamically stable.   Delay start of Pharmacological VTE agent (>24hrs) due to surgical blood loss or risk of bleeding: no  INDICATION FOR PROCEDURE: [name] a 67 74o. male with ischemic rest pain of the left leg. CT angiogram shows occlusion of previously placed femoral-femoral bypass with left sided total iliac occlusion. After careful discussion of risks, benefits, and alternatives the patient was offered hybrid repair. The patient understood and wished to proceed.  OPERATIVE FINDINGS:  Terminal aorta and iliac arteries: Redemonstration of total iliac occlusion on the left.  I was able to cross this antegrade.  Multiple Viabahn & VBX stents were ultimately used to restore patency to the left common femoral artery.  The common femoral artery did appear disease, but had about 50% stenosis which was not flow-limiting.  Elected to not perform common femoral endarterectomy at this time.   DESCRIPTION OF PROCEDURE: After identification of the patient in the pre-operative holding area, the patient was transferred to the operating room. The patient was positioned supine on the operating room table.  Anesthesia was induced. The groins was prepped and draped in standard fashion. A  surgical pause was performed confirming correct patient, procedure, and operative location.  The right groin was anesthetized with subcutaneous injection of 1% lidocaine. Using ultrasound guidance, the right common femoral artery was accessed with micropuncture technique. Fluoroscopy was used to confirm cannulation over the femoral head. The 38F sheath was upsized to 67F.   A Benson wire was advanced into the distal aorta. Over the wire an omni flush catheter was advanced to the level of L2. Aortogram was performed - see above for details. The patient was systemically anticoagulated.  A 7 French Oscor sheath was advanced into the terminal aorta.  I was able to cross the chronic total occlusion of the iliac system with a Glidewire advantage and quick cross catheter.  Advanced the catheter across the disease and performed angiogram distal to the occlusion.  This confirmed true lumen position.  We then elected to intervene.  I "built up" stent system with 9 x 5 mm Viabahn's distally and a 10 x 59 mm VBX at the common iliac artery into the Viabahn system.  I did try to use an 8 x 60 Innova stent, but this was not effective.  The stenting system was then postdilated with an 8 x 60 mm balloon.  Good flow was seen from the terminal aorta to the common femoral artery.  Common femoral artery did have 50% stenosis.  This did not appear to be flow-limiting.  I elected to not perform femoral endarterectomy and monitor the patient carefully for symptom resolution.  A perclose was used to close the arteriotomy. Hemostasis was excellent upon completion.  Upon completion of the case instrument and sharps counts were confirmed correct. The patient was  transferred to the PACU in good condition. I was present for all portions of the procedure.  PLAN: ASA '81mg'$  PO QD. Plavix '75mg'$  PO QD. High intensity statin therapy. Follow up with me in 4 weeks with ABI and aorto iliac duplex. If he does not stop smoking the stenting system  will fail.   Yevonne Aline. Stanford Breed, MD [facility] Phone Number: [phone] 04/12/2022 1:40 PM

## 2022-04-12 NOTE — Anesthesia Preprocedure Evaluation (Signed)
Anesthesia Evaluation  Patient identified by MRN, date of birth, ID band Patient awake    Reviewed: Allergy & Precautions, NPO status , Patient's Chart, lab work & pertinent test results  Airway Mallampati: I  TM Distance: >3 FB Neck ROM: Full    Dental  (+) Edentulous Upper, Dental Advisory Given   Pulmonary former smoker   breath sounds clear to auscultation       Cardiovascular hypertension, Pt. on medications + Peripheral Vascular Disease   Rhythm:Regular Rate:Normal     Neuro/Psych    GI/Hepatic negative GI ROS, Neg liver ROS,,,  Endo/Other  negative endocrine ROS    Renal/GU negative Renal ROS     Musculoskeletal negative musculoskeletal ROS (+)    Abdominal   Peds  Hematology negative hematology ROS (+)   Anesthesia Other Findings   Reproductive/Obstetrics                             Anesthesia Physical Anesthesia Plan  ASA: 3  Anesthesia Plan: General   Post-op Pain Management: Ofirmev IV (intra-op)*   Induction: Intravenous  PONV Risk Score and Plan: 3 and Ondansetron, Dexamethasone and Midazolam  Airway Management Planned: Oral ETT  Additional Equipment: Arterial line  Intra-op Plan:   Post-operative Plan: Extubation in OR  Informed Consent: I have reviewed the patients History and Physical, chart, labs and discussed the procedure including the risks, benefits and alternatives for the proposed anesthesia with the patient or authorized representative who has indicated his/her understanding and acceptance.     Dental advisory given  Plan Discussed with: CRNA  Anesthesia Plan Comments:        Anesthesia Quick Evaluation

## 2022-04-12 NOTE — Progress Notes (Signed)
A Line discontinued pressure dressing in place and foley discontinue at this time

## 2022-04-12 NOTE — Anesthesia Procedure Notes (Signed)
Procedure Name: Intubation Date/Time: 04/12/2022 10:59 AM  Performed by: Trinna Post., CRNAPre-anesthesia Checklist: Patient identified, Emergency Drugs available, Suction available, Patient being monitored and Timeout performed Patient Re-evaluated:Patient Re-evaluated prior to induction Oxygen Delivery Method: Circle system utilized Preoxygenation: Pre-oxygenation with 100% oxygen Induction Type: IV induction Ventilation: Mask ventilation without difficulty Laryngoscope Size: Mac and 4 Grade View: Grade I Tube type: Oral Tube size: 7.5 mm Number of attempts: 1 Airway Equipment and Method: Stylet Placement Confirmation: ETT inserted through vocal cords under direct vision, breath sounds checked- equal and bilateral and positive ETCO2 Secured at: 23 cm Tube secured with: Tape Dental Injury: Teeth and Oropharynx as per pre-operative assessment

## 2022-04-12 NOTE — Progress Notes (Signed)
Patient did ambulate on unit voice no compliants of pain states "leg feels better" right groin  incision continues to be dry and intact patient awaiting family to pick up to transport home

## 2022-04-12 NOTE — Interval H&P Note (Signed)
History and Physical Interval Note:  04/12/2022 10:28 AM  [name]  has presented today for surgery, with the diagnosis of Aortoiliac occlusive disease; Claudication of left lower extremity.  The various methods of treatment have been discussed with the patient and family. After consideration of risks, benefits and other options for treatment, the patient has consented to  Procedure(s): LEFT COMMON FEMORAL ENDARTERECTOMY (Left) INSERTION OF ILIAC STENT (Left) as a surgical intervention.  The patient's history has been reviewed, patient examined, no change in status, stable for surgery.  I have reviewed the patient's chart and labs.  Questions were answered to the patient's satisfaction.     Cherre Robins

## 2022-04-12 NOTE — Anesthesia Procedure Notes (Signed)
Arterial Line Insertion Start/End3/14/2024 11:00 AM Performed by: Trinna Post., CRNA  Patient location: OR. Preanesthetic checklist: patient identified, IV checked, site marked, risks and benefits discussed, surgical consent, monitors and equipment checked, pre-op evaluation, timeout performed and anesthesia consent Patient sedated Right, radial was placed Catheter size: 20 G Maximum sterile barriers used   Attempts: 2 Procedure performed without using ultrasound guided technique. Following insertion, dressing applied and Biopatch. Post procedure assessment: normal  Patient tolerated the procedure well with no immediate complications.

## 2022-04-12 NOTE — Discharge Instructions (Signed)
° °  [facility] [place] ° °Discharge Instructions ° °Lower Extremity Angiogram; Angioplasty/Stenting ° °Please refer to the following instructions for your post-procedure care. Your surgeon or physician assistant will discuss any changes with you. ° °Activity ° °Avoid lifting more than 8 pounds (1 gallons of milk) for 72 hours (3 days) after your procedure. You may walk as much as you can tolerate. It's OK to drive after 72 hours. ° °Bathing/Showering ° °You may shower the day after your procedure. If you have a bandage, you may remove it at 24- 48 hours. Clean your incision site with mild soap and water. Pat the area dry with a clean towel. ° °Diet ° °Resume your pre-procedure diet. There are no special food restrictions following this procedure. All patients with peripheral vascular disease should follow a low fat/low cholesterol diet. In order to heal from your surgery, it is CRITICAL to get adequate nutrition. Your body requires vitamins, minerals, and protein. Vegetables are the best source of vitamins and minerals. Vegetables also provide the perfect balance of protein. Processed food has little nutritional value, so try to avoid this. ° °Medications ° °Resume taking all of your medications unless your doctor tells you not to. If your incision is causing pain, you may take over-the-counter pain relievers such as acetaminophen (Tylenol) ° °Follow Up ° °Follow up will be arranged at the time of your procedure. You may have an office visit scheduled or may be scheduled for surgery. Ask your surgeon if you have any questions. ° °Please call us immediately for any of the following conditions: °•Severe or worsening pain your legs or feet at rest or with walking. °•Increased pain, redness, drainage at your groin puncture site. °•Fever of 101 degrees or higher. °•If you have any mild or slow bleeding from your puncture site: lie down, apply firm constant pressure over the area with a piece of  gauze or a clean wash cloth for 30 minutes- no peeking!, call 911 right away if you are still bleeding after 30 minutes, or if the bleeding is heavy and unmanageable. ° °Reduce your risk factors of vascular disease: ° °Stop smoking. If you would like help call [facility] at 1-800-QUIT-NOW (1-800-784-8669) or [place] at 336-586-4000. °Manage your cholesterol °Maintain a desired weight °Control your diabetes °Keep your blood pressure down ° °If you have any questions, please call the office at [phone] ° °

## 2022-04-13 NOTE — OR Nursing (Signed)
Intra-Op entry updated to reflect correct actions of stents used and opened during case.

## 2022-04-13 NOTE — Anesthesia Postprocedure Evaluation (Signed)
Anesthesia Post Note  Patient: [name]  Procedure(s) Performed: LEFT COMMON AND EXTERNIAL ILIAC INSERTION OF STENT (Left) ULTRASOUND GUIDANCE FOR VASCULAR ACCESS AORTOGRAM     Patient location during evaluation: PACU Anesthesia Type: General Level of consciousness: awake and alert Pain management: pain level controlled Vital Signs Assessment: post-procedure vital signs reviewed and stable Respiratory status: spontaneous breathing, nonlabored ventilation, respiratory function stable and patient connected to nasal cannula oxygen Cardiovascular status: blood pressure returned to baseline and stable Postop Assessment: no apparent nausea or vomiting Anesthetic complications: no   No notable events documented.  Last Vitals:  Vitals:   04/12/22 1515 04/12/22 1530  BP: (!) 146/93 139/89  Pulse: 75 72  Resp: 13 14  Temp:  36.6 C  SpO2: 95% 96%    Last Pain:  Vitals:   04/12/22 1530  TempSrc:   PainSc: 0-No pain                 Effie Berkshire

## 2022-04-16 ENCOUNTER — Encounter (HOSPITAL_COMMUNITY): Payer: Self-pay | Admitting: Vascular Surgery

## 2022-05-03 ENCOUNTER — Other Ambulatory Visit: Payer: Self-pay | Admitting: *Deleted

## 2022-05-03 DIAGNOSIS — I739 Peripheral vascular disease, unspecified: Secondary | ICD-10-CM

## 2022-05-03 DIAGNOSIS — I7409 Other arterial embolism and thrombosis of abdominal aorta: Secondary | ICD-10-CM

## 2022-05-07 ENCOUNTER — Telehealth: Payer: Self-pay

## 2022-05-07 NOTE — Telephone Encounter (Signed)
Per [facility], pt will need a Peer to Peer review for services done and not authorized on 04/12/22.  He was originally scheduled for an Endarterectomy, which was approved.  The day of surgery, he did not need this procedure and an less invasive angiogram was done.  Insurance Rep says they need to know why.  Dr. Lenell Antu was contacted and has agreed to speak with their Medical Director so that pts surgery will be approved.

## 2022-05-15 ENCOUNTER — Ambulatory Visit (INDEPENDENT_AMBULATORY_CARE_PROVIDER_SITE_OTHER)
Admission: RE | Admit: 2022-05-15 | Discharge: 2022-05-15 | Disposition: A | Discharge status: Home / Self Care | Payer: Medicare HMO | Source: Ambulatory Visit | Attending: Vascular Surgery | Admitting: Vascular Surgery

## 2022-05-15 ENCOUNTER — Ambulatory Visit (HOSPITAL_COMMUNITY)
Admission: RE | Admit: 2022-05-15 | Discharge: 2022-05-15 | Disposition: A | Discharge status: Home / Self Care | Payer: Medicare HMO | Source: Ambulatory Visit | Attending: Vascular Surgery | Admitting: Vascular Surgery

## 2022-05-15 ENCOUNTER — Encounter: Payer: Self-pay | Admitting: Vascular Surgery

## 2022-05-15 ENCOUNTER — Ambulatory Visit (INDEPENDENT_AMBULATORY_CARE_PROVIDER_SITE_OTHER): Payer: Medicare HMO | Admitting: Vascular Surgery

## 2022-05-15 VITALS — BP 126/87 | HR 65 | Temp 98.7°F | Resp 20 | Ht 67.0 in | Wt 165.0 lb

## 2022-05-15 DIAGNOSIS — I7409 Other arterial embolism and thrombosis of abdominal aorta: Secondary | ICD-10-CM

## 2022-05-15 DIAGNOSIS — I739 Peripheral vascular disease, unspecified: Secondary | ICD-10-CM | POA: Insufficient documentation
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
## 2022-05-16 LAB — VAS US ABI WITH/WO TBI
Left ABI: 0.85
Right ABI: 0.75

## 2022-10-04 IMAGING — CR DG TOE 5TH 2+V*L*
3 series · 3 of 3 positions shown · non-contrast
Comparison: None.

CLINICAL DATA: Nonhealing wound of the fifth digit

EXAM:
DG TOE 5TH LEFT

[toe ap]
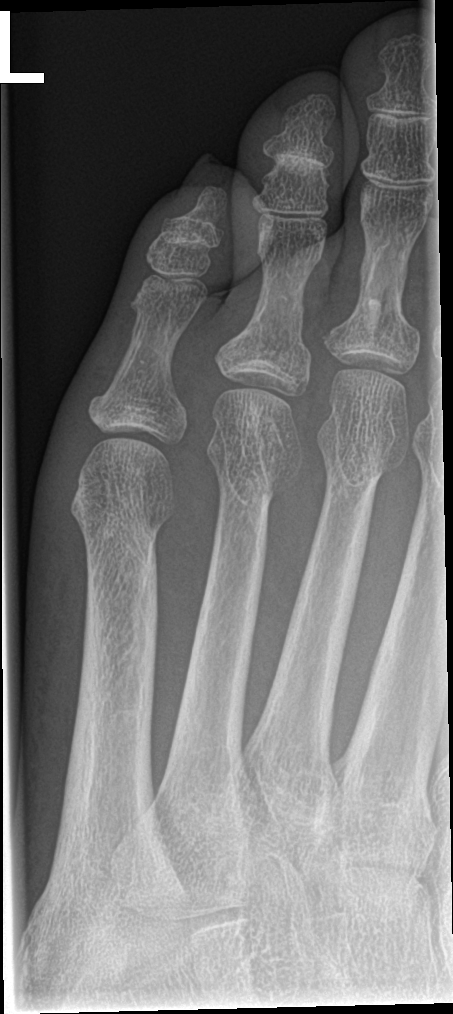

[toe obl]
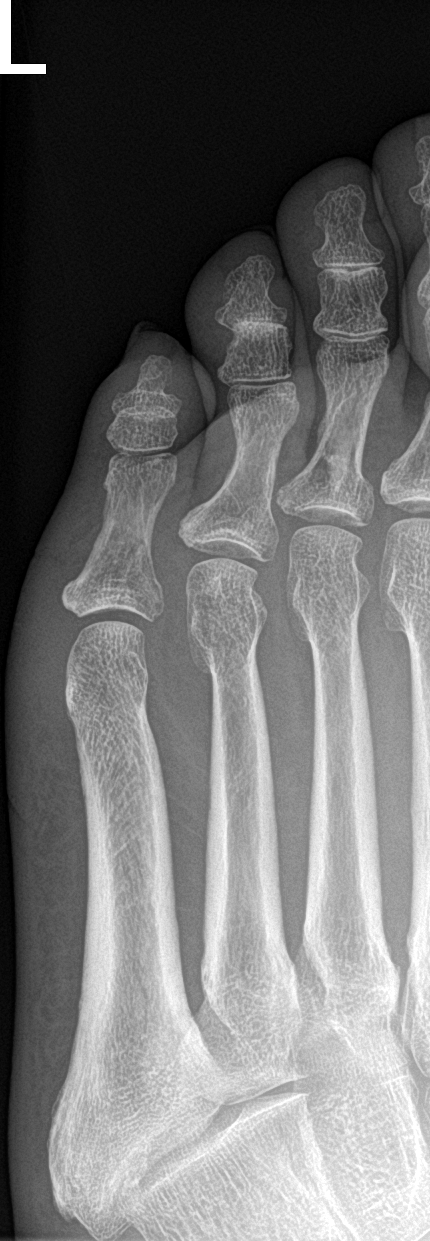

[toe lat]
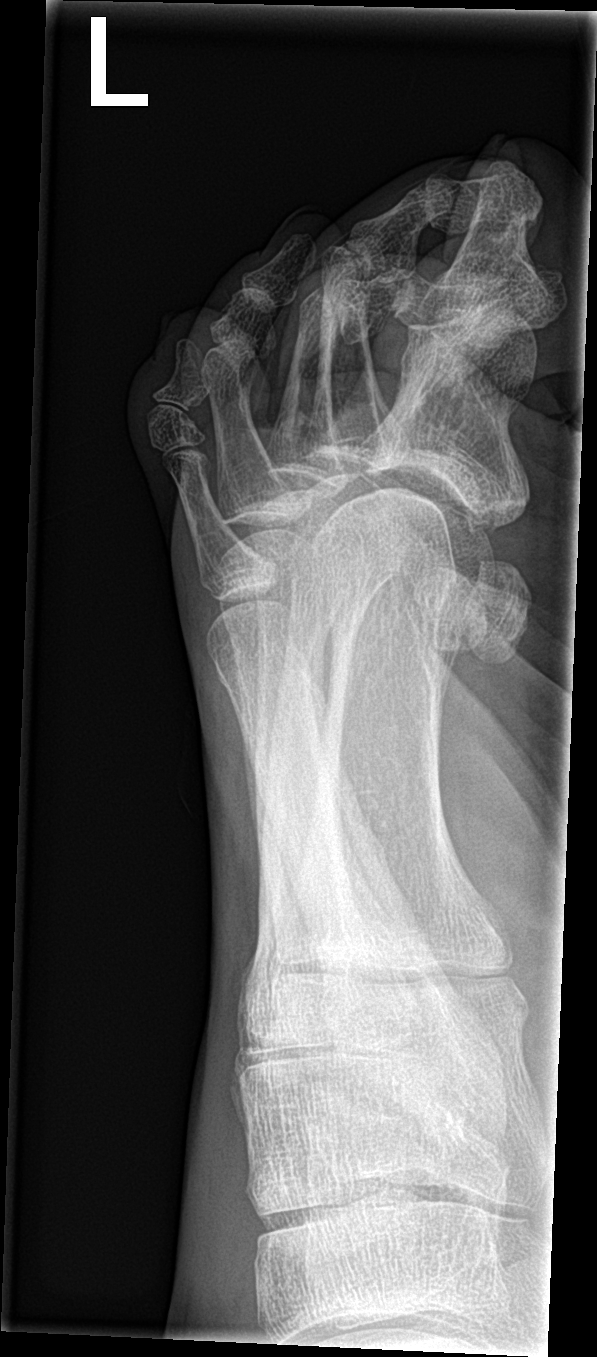

[3 of 3 positions shown; findings below may reference images not displayed]

FINDINGS: There is soft tissue swelling without evidence for an acute
displaced fracture or dislocation. There is no radiopaque foreign
body. There is no radiographic evidence for osteomyelitis.
IMPRESSION: Soft tissue swelling without evidence for an acute displaced
fracture or dislocation. No radiographic evidence for osteomyelitis.
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
## 2022-11-13 ENCOUNTER — Encounter: Payer: Self-pay | Admitting: Vascular Surgery

## 2022-11-13 ENCOUNTER — Ambulatory Visit (INDEPENDENT_AMBULATORY_CARE_PROVIDER_SITE_OTHER): Payer: Medicare HMO | Admitting: Vascular Surgery

## 2022-11-13 VITALS — BP 127/89 | HR 68 | Temp 98.1°F | Ht 67.0 in | Wt 164.6 lb

## 2022-11-13 DIAGNOSIS — I739 Peripheral vascular disease, unspecified: Secondary | ICD-10-CM

## 2022-11-13 MED ORDER — CLOPIDOGREL BISULFATE 75 MG PO TABS: 75.0000 mg | ORAL_TABLET | Freq: Every day | ORAL | 11 refills | Status: DC

## 2022-11-13 MED ORDER — ASPIRIN 81 MG PO TBEC: 81.0000 mg | DELAYED_RELEASE_TABLET | Freq: Every day | ORAL | 11 refills | Status: DC

## 2022-12-07 ENCOUNTER — Other Ambulatory Visit: Payer: Self-pay

## 2022-12-07 DIAGNOSIS — I739 Peripheral vascular disease, unspecified: Secondary | ICD-10-CM

## 2022-12-07 DIAGNOSIS — I7409 Other arterial embolism and thrombosis of abdominal aorta: Secondary | ICD-10-CM

## 2023-01-29 IMAGING — CT CT CHEST W/O CM
1 series · 15 of 34 positions shown, 19 images · non-contrast
Comparison: None.

CLINICAL DATA: Lung nodule, tobacco abuse.

EXAM:
CT CHEST WITHOUT CONTRAST
TECHNIQUE: Multidetector CT imaging of the chest was performed following the
standard protocol without IV contrast.

[Series 2: chest w/(date) · axial · 0.69mm/px · z∈[-291,-9]mm · 15 of 167 slices shown, 19 images]
[im 13/167  mediastinal]
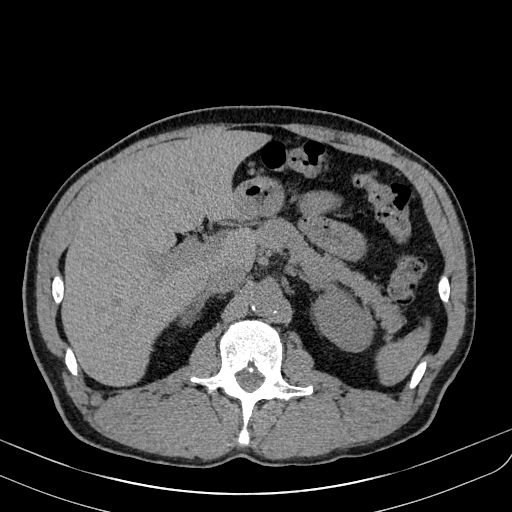
[im 13/167  lung]
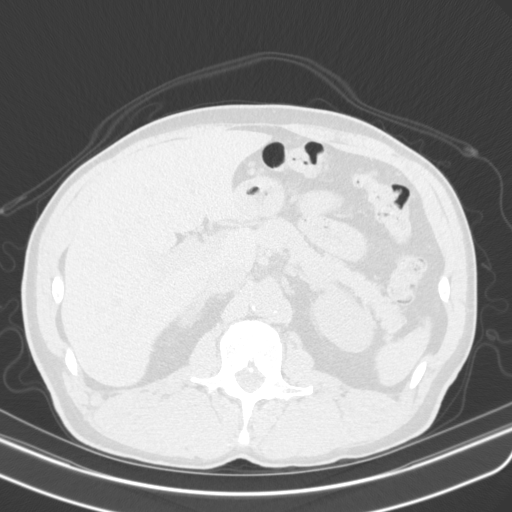
[im 25/167  lung]
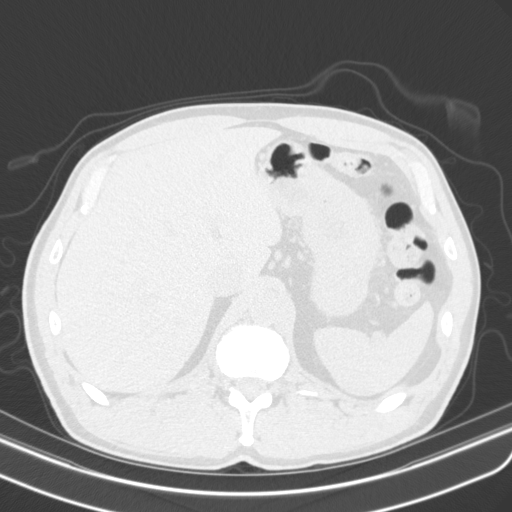
[im 34/167  lung]
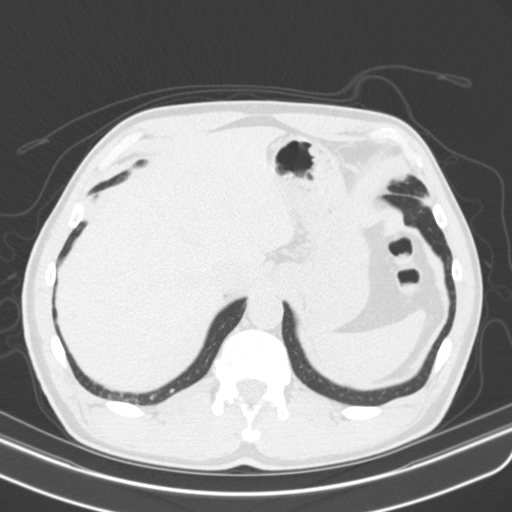
[im 44/167  lung]
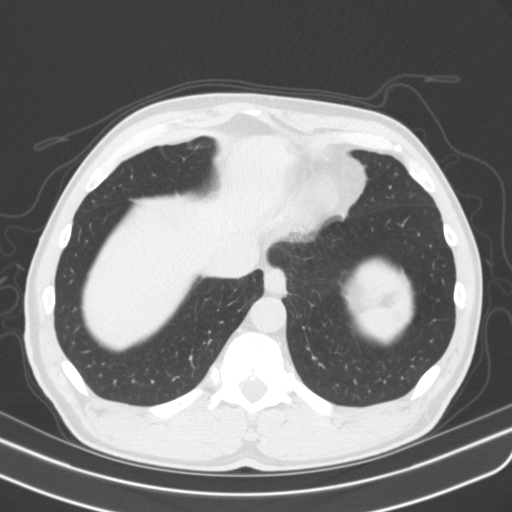
[im 56/167  mediastinal]
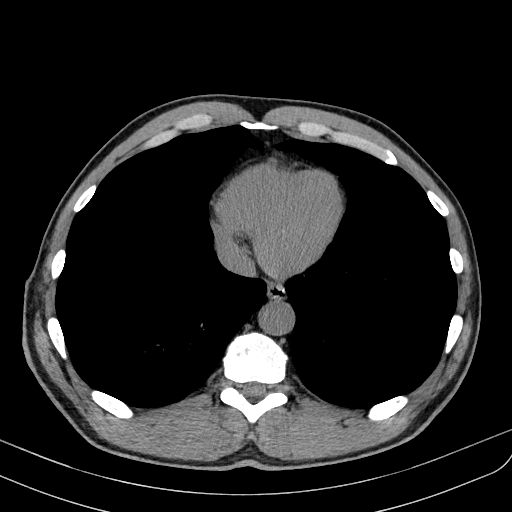
[im 56/167  lung]
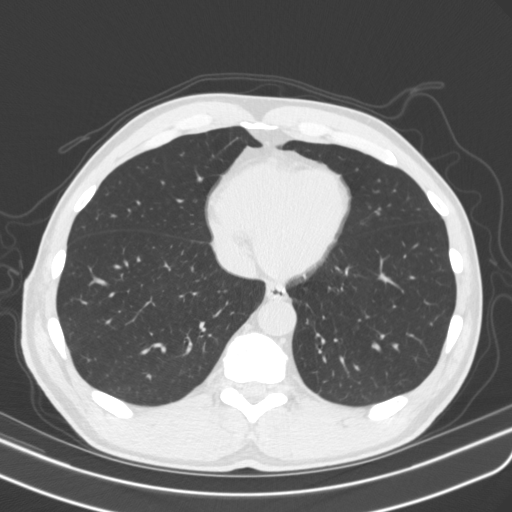
[im 67/167  lung]
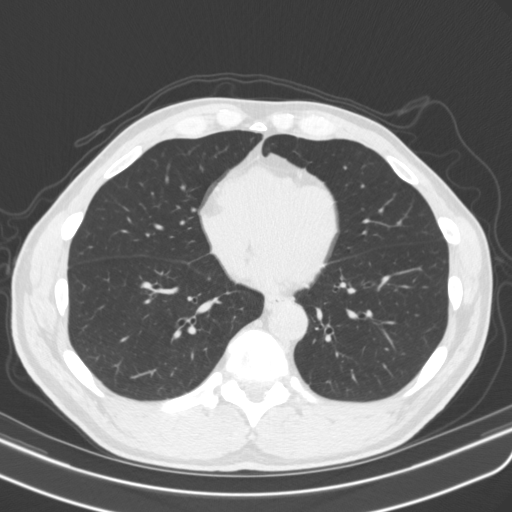
[im 74/167  lung]
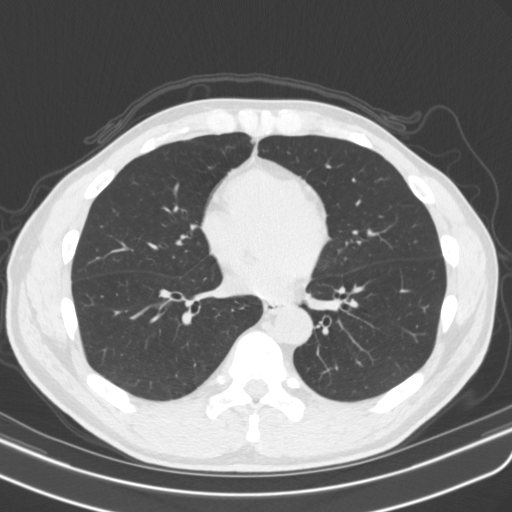
[im 87/167  lung]
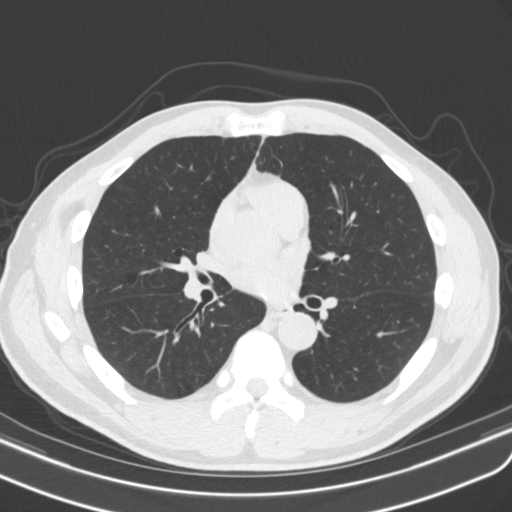
[im 93/167  mediastinal]
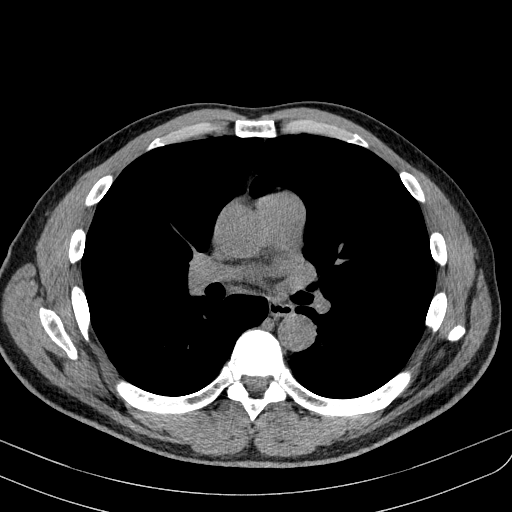
[im 93/167  lung]
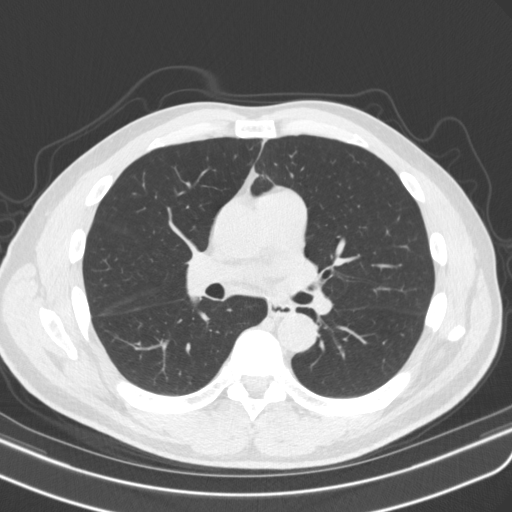
[im 100/167  lung]
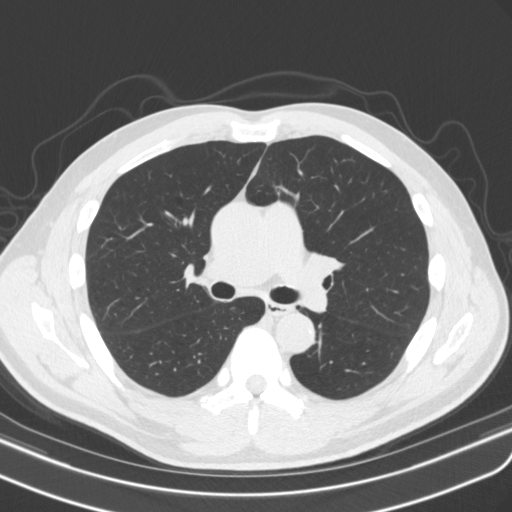
[im 111/167  lung]
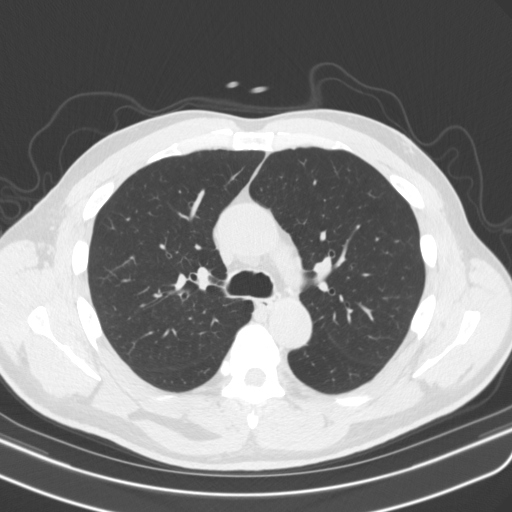
[im 123/167  lung]
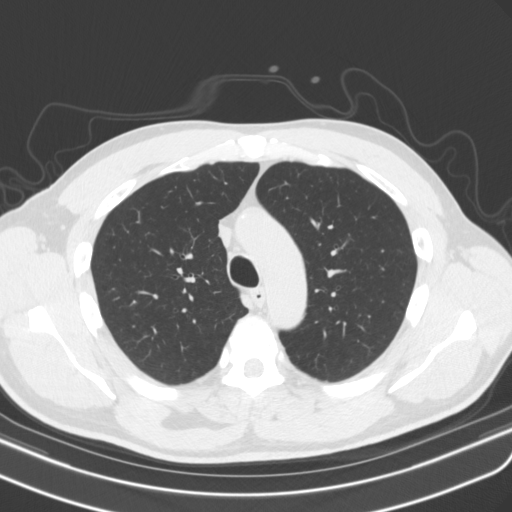
[im 133/167  mediastinal]
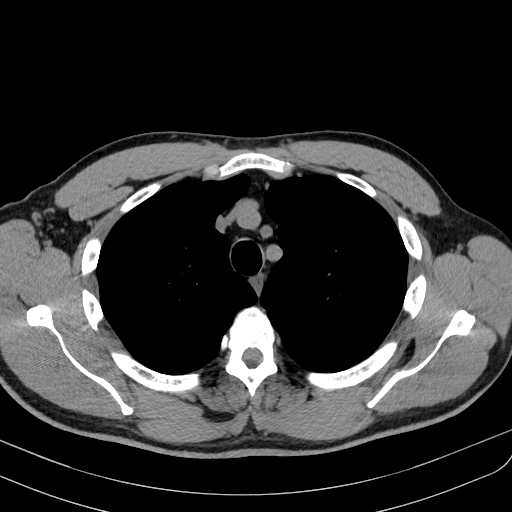
[im 133/167  lung]
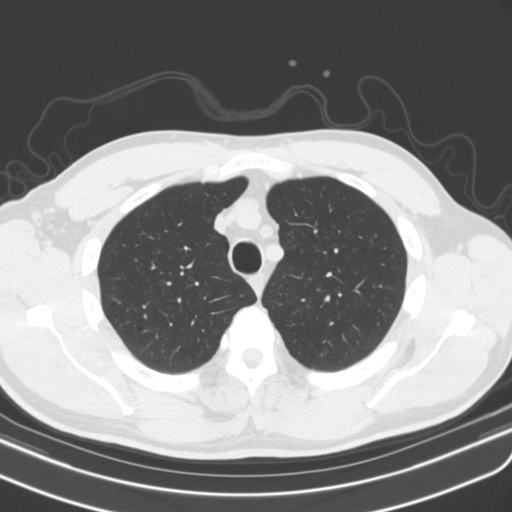
[im 142/167  lung]
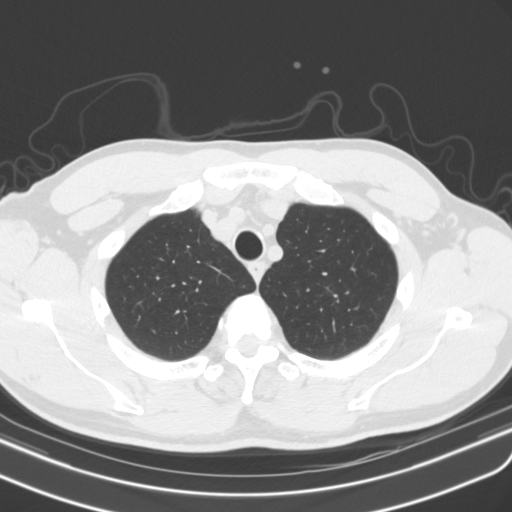
[im 154/167  lung]
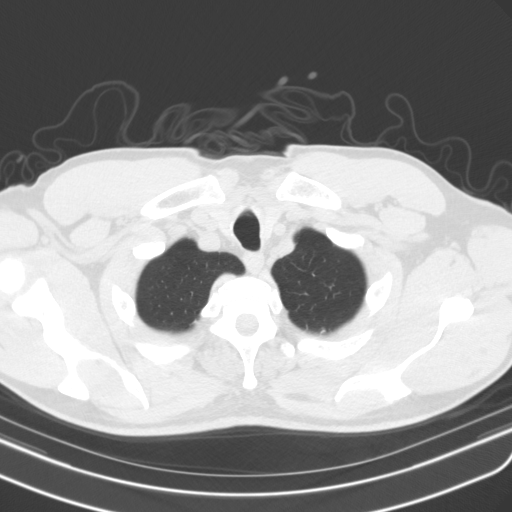

[15 of 34 positions shown; findings below may reference images not displayed]

FINDINGS: Cardiovascular: Mild coronary artery calcification. Global cardiac
size within normal limits. No pericardial effusion. Central
pulmonary arteries are of normal caliber. Mild atherosclerotic
calcification within the thoracic aorta. No aortic aneurysm.

Mediastinum/Nodes: The visualized thyroid is unremarkable. No
pathologic thoracic adenopathy. Esophagus unremarkable.

Lungs/Pleura: A single impacted airway within the right costophrenic
angle is nonspecific, possibly the sequela of remote infection or
inflammation. The lungs are otherwise clear. No pneumothorax or
pleural effusion. The central airways are widely patent.

Upper Abdomen: Scattered cysts are seen within the visualized liver.
Limited images of the upper abdomen are otherwise unremarkable.

Musculoskeletal: No acute bone abnormality. No lytic or blastic bone
lesions are seen.
IMPRESSION: No suspicious focal pulmonary nodule or infiltrate. No acute
intrathoracic pathology.

Mild coronary artery calcification

Aortic Atherosclerosis ([id number]).

## 2023-03-27 ENCOUNTER — Encounter (HOSPITAL_COMMUNITY): Payer: Self-pay

## 2023-03-27 ENCOUNTER — Emergency Department (HOSPITAL_COMMUNITY)
Admission: EM | Admit: 2023-03-27 | Discharge: 2023-03-28 | Disposition: A | Discharge status: Home / Self Care | Payer: Medicare HMO | Attending: Emergency Medicine | Admitting: Emergency Medicine

## 2023-03-27 ENCOUNTER — Other Ambulatory Visit: Payer: Self-pay

## 2023-03-27 ENCOUNTER — Emergency Department (HOSPITAL_COMMUNITY): Payer: Medicare HMO

## 2023-03-27 DIAGNOSIS — R55 Syncope and collapse: Secondary | ICD-10-CM | POA: Diagnosis present

## 2023-03-27 DIAGNOSIS — D509 Iron deficiency anemia, unspecified: Secondary | ICD-10-CM

## 2023-03-27 DIAGNOSIS — I629 Nontraumatic intracranial hemorrhage, unspecified: Secondary | ICD-10-CM | POA: Diagnosis not present

## 2023-03-27 DIAGNOSIS — Z79899 Other long term (current) drug therapy: Secondary | ICD-10-CM | POA: Diagnosis not present

## 2023-03-27 DIAGNOSIS — Z87891 Personal history of nicotine dependence: Secondary | ICD-10-CM | POA: Insufficient documentation

## 2023-03-27 DIAGNOSIS — I10 Essential (primary) hypertension: Secondary | ICD-10-CM | POA: Diagnosis not present

## 2023-03-27 DIAGNOSIS — R569 Unspecified convulsions: Secondary | ICD-10-CM | POA: Diagnosis not present

## 2023-03-27 LAB — CBC WITH DIFFERENTIAL/PLATELET
Abs Immature Granulocytes: 0.02 10*3/uL (ref 0.00–0.07)
Basophils Absolute: 0 10*3/uL (ref 0.0–0.1)
Basophils Relative: 0 %
Eosinophils Absolute: 0 10*3/uL (ref 0.0–0.5)
Eosinophils Relative: 1 %
HCT: 29.4 % — ABNORMAL LOW (ref 39.0–52.0)
Hemoglobin: 9.9 g/dL — ABNORMAL LOW (ref 13.0–17.0)
Immature Granulocytes: 0 %
Lymphocytes Relative: 12 %
Lymphs Abs: 0.7 10*3/uL (ref 0.7–4.0)
MCH: 31.5 pg (ref 26.0–34.0)
MCHC: 33.7 g/dL (ref 30.0–36.0)
MCV: 93.6 fL (ref 80.0–100.0)
Monocytes Absolute: 0.3 10*3/uL (ref 0.1–1.0)
Monocytes Relative: 5 %
Neutro Abs: 4.7 10*3/uL (ref 1.7–7.7)
Neutrophils Relative %: 82 %
Platelets: 212 10*3/uL (ref 150–400)
RBC: 3.14 MIL/uL — ABNORMAL LOW (ref 4.22–5.81)
RDW: 13.3 % (ref 11.5–15.5)
WBC: 5.8 10*3/uL (ref 4.0–10.5)
nRBC: 0 % (ref 0.0–0.2)

## 2023-03-27 LAB — COMPREHENSIVE METABOLIC PANEL
ALT: 47 U/L — ABNORMAL HIGH (ref 0–44)
AST: 29 U/L (ref 15–41)
Albumin: 2.6 g/dL — ABNORMAL LOW (ref 3.5–5.0)
Alkaline Phosphatase: 40 U/L (ref 38–126)
Anion gap: 10 (ref 5–15)
BUN: 20 mg/dL (ref 8–23)
CO2: 17 mmol/L — ABNORMAL LOW (ref 22–32)
Calcium: 7.3 mg/dL — ABNORMAL LOW (ref 8.9–10.3)
Chloride: 113 mmol/L — ABNORMAL HIGH (ref 98–111)
Creatinine, Ser: 1.42 mg/dL — ABNORMAL HIGH (ref 0.61–1.24)
GFR, Estimated: 54 mL/min — ABNORMAL LOW (ref >60)
Glucose, Bld: 93 mg/dL (ref 70–99)
Potassium: 2.9 mmol/L — ABNORMAL LOW (ref 3.5–5.1)
Sodium: 140 mmol/L (ref 135–145)
Total Bilirubin: 0.3 mg/dL (ref 0.0–1.2)
Total Protein: 4.4 g/dL — ABNORMAL LOW (ref 6.5–8.1)

## 2023-03-27 LAB — TROPONIN I (HIGH SENSITIVITY)
Troponin I (High Sensitivity): 5 ng/L (ref <18)
Troponin I (High Sensitivity): 6 ng/L (ref <18)

## 2023-03-27 MED ORDER — LACTATED RINGERS IV BOLUS
1000.0000 mL | Freq: Once | INTRAVENOUS | Status: AC
  Administered 2023-03-27: 1000 mL via INTRAVENOUS

## 2023-03-27 MED ORDER — DIPHENHYDRAMINE HCL 50 MG/ML IJ SOLN
25.0000 mg | Freq: Once | INTRAMUSCULAR | Status: AC
  Administered 2023-03-28: 25 mg via INTRAVENOUS
  Filled 2023-03-27: qty 1

## 2023-03-27 MED ORDER — PROCHLORPERAZINE EDISYLATE 10 MG/2ML IJ SOLN
10.0000 mg | Freq: Once | INTRAMUSCULAR | Status: AC
  Administered 2023-03-28: 10 mg via INTRAVENOUS
  Filled 2023-03-27: qty 2

## 2023-03-27 NOTE — ED Provider Notes (Signed)
 4:12 PM Care assumed to Dr. Andria Meuse.  At time of transfer of care, patient is awaiting results of delta troponin, labs, and CT head after a syncopal episode.  If workup is fully reassuring and he continues to feel well, will consider discharge home per previous team recommendations.  11:15 PM Troponins returned normal x 2 however patient's  CT head shows intracranial bleed.  Will order headache cocktail and speak to neurology but anticipate admission for further monitoring and management as he is on aspirin and Plavix.  11:30 PM Spoke to neurology who recommended MRI brain with and without contrast to further evaluate.  They will see patient in after workup to decide if he needs admission or not.  11:32 PM Care will be transferred oncoming team to await results of MRI with and without contrast and will need to talk to neurology after MRI is done.    Teal Bontrager, Canary Brim, MD 03/27/23 [phone]
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
## 2023-03-27 NOTE — ED Notes (Signed)
 Pt ambulatory to the bathroom. Denies dizziness, continues to endorse a headache.

## 2023-03-27 NOTE — ED Notes (Signed)
 Patient transported to CT

## 2023-03-27 NOTE — ED Triage Notes (Signed)
 Pt BIB [facility] from work where he had a syncopal episode, fell straight back & hit his head on the floor. His co-workers report he had his eyes open the entire time he was on the floor not responding to them. EMS reports upon their arrival he was A/Ox4, denies any Hx of syncopal episodes or any cardiac events. EMS did see elevation in his inferior leads & STEMI was activated, SBP was 90, 324 ASA given & 200cc NS in Lt AC PIV. Before arrival STEMI was canceled & his last BP was 100/70.

## 2023-03-28 ENCOUNTER — Emergency Department (HOSPITAL_COMMUNITY): Payer: Medicare HMO

## 2023-03-28 ENCOUNTER — Emergency Department (HOSPITAL_BASED_OUTPATIENT_CLINIC_OR_DEPARTMENT_OTHER): Payer: Medicare HMO

## 2023-03-28 DIAGNOSIS — R55 Syncope and collapse: Secondary | ICD-10-CM

## 2023-03-28 DIAGNOSIS — I629 Nontraumatic intracranial hemorrhage, unspecified: Secondary | ICD-10-CM

## 2023-03-28 DIAGNOSIS — R569 Unspecified convulsions: Secondary | ICD-10-CM

## 2023-03-28 LAB — ECHOCARDIOGRAM COMPLETE
Area-P 1/2: 3.37 cm2
Height: 67 in
S' Lateral: 2.4 cm
Weight: 2560 [oz_av]

## 2023-03-28 LAB — POTASSIUM: Potassium: 4.6 mmol/L (ref 3.5–5.1)

## 2023-03-28 MED ORDER — POTASSIUM CHLORIDE CRYS ER 20 MEQ PO TBCR
20.0000 meq | EXTENDED_RELEASE_TABLET | Freq: Once | ORAL | Status: AC
  Administered 2023-03-28: 20 meq via ORAL
  Filled 2023-03-28: qty 1

## 2023-03-28 MED ORDER — IOHEXOL 350 MG/ML SOLN
75.0000 mL | Freq: Once | INTRAVENOUS | Status: AC | PRN
  Administered 2023-03-28: 75 mL via INTRAVENOUS

## 2023-03-28 MED ORDER — GADOBUTROL 1 MMOL/ML IV SOLN
7.0000 mL | Freq: Once | INTRAVENOUS | Status: AC | PRN
  Administered 2023-03-28: 7 mL via INTRAVENOUS

## 2023-03-28 MED ORDER — FERROUS SULFATE 325 (65 FE) MG PO TABS: 325.0000 mg | ORAL_TABLET | Freq: Every day | ORAL | 0 refills | Status: DC

## 2023-03-28 MED ORDER — POTASSIUM CHLORIDE 10 MEQ/100ML IV SOLN
10.0000 meq | INTRAVENOUS | Status: AC
Start: 2023-03-28 — End: 2023-03-28
  Administered 2023-03-28 (×2): 10 meq via INTRAVENOUS
  Filled 2023-03-28 (×2): qty 100

## 2023-03-28 NOTE — Procedures (Signed)
 Patient Name: [name]  MRN: [id number]  Epilepsy Attending: Charlsie Quest  Referring Physician/Provider: Erick Blinks, MD  Date:03/28/2023 Duration: 25.34 mins  Patient history: 68 y.o. male with hx of hypertension, gout, peripheral vascular disease status post stenting and on antiplatelet agents who presents after a syncopal episode and fall and hit the back of his head. EEG to evaluate for seizure  Level of alertness: Awake, asleep  AEDs during EEG study: None  Technical aspects: This EEG study was done with scalp electrodes positioned according to the 10-20 International system of electrode placement. Electrical activity was reviewed with band pass filter of 1-70Hz , sensitivity of 7 uV/mm, display speed of 80mm/sec with a 60Hz  notched filter applied as appropriate. EEG data were recorded continuously and digitally stored.  Video monitoring was available and reviewed as appropriate.  Description: The posterior dominant rhythm consists of 9-10 Hz activity of moderate voltage (25-35 uV) seen predominantly in posterior head regions, symmetric and reactive to eye opening and eye closing. Sleep was characterized by vertex waves, sleep spindles (12 to 14 Hz), maximal frontocentral region. Hyperventilation and photic stimulation were not performed.     IMPRESSION: This study is within normal limits. No seizures or epileptiform discharges were seen throughout the recording.  A normal interictal EEG does not exclude the diagnosis of epilepsy.  Delaine Hernandez Annabelle Harman

## 2023-03-28 NOTE — ED Notes (Signed)
 Patient transported to MRI

## 2023-03-28 NOTE — ED Provider Notes (Signed)
 68 yo male presenting with syncope with prodrome Small bleed seen on MRI  Initially code STEMI but ST elevations felt to be likely BER; pt not complaining of chest pain and delta troponins negative  Pending EEG and echocardiogram per neurology and NSGY recommendations  Per Dr Acey Lav discussion with neurosurgery specialists - patient is NOT needing repeat neuro imaging; pending only echo for PFO evaluation  Physical Exam  BP 116/84   Pulse 80   Temp 98.1 F (36.7 C) (Oral)   Resp 15   Ht 5\' 7"  (1.702 m)   Wt 72.6 kg   SpO2 95%   BMI 25.06 kg/m   Physical Exam  Procedures  Procedures  ED Course / MDM   Clinical Course as of 03/28/23 1404  Thu Mar 28, 2023  0850 Repaging NSGY to discuss pt's antiplatelet medications - plavex and aspirin [MT]  0850 EEG unremarkable, awaiting echo [MT]  0934 Dr Wille Glaser NSGY recommending holding aspirin and plavex for 7 days, then resuming. [MT]  1136 Echo completed - awaiting read [MT]  1401 Echocardiogram with no emergent findings.  Patient was reevaluated remains asymptomatic with nearly 24 hours observation in the ED now an extensive workup.  He is noted to have mild anemia, hypokalemia which is resolved with treatment.  I am recommending follow-up with a neurosurgeon for his brain bleed, and recommendations were passed along to hold aspirin and Plavix.  Will also consider cardiology referral for potential telemetry monitoring as an outpatient as we are not able to do Holter monitoring long-term in the ED.  But he is stable for discharge at this point. [MT]    Clinical Course User Index [MT] Latiya Navia, Kermit Balo, MD   Medical Decision Making Amount and/or Complexity of Data Reviewed Labs: ordered. Radiology: ordered.  Risk OTC drugs. Prescription drug management.         Terald Sleeper, MD 03/28/23 [phone]

## 2023-03-28 NOTE — Progress Notes (Signed)
 Patient not available for EEG at moment due to the patient being a imaging.  Tech will try back as schedule allows.

## 2023-03-28 NOTE — ED Provider Notes (Signed)
  Provider Note MRN:  [id number]  Arrival date & time: 03/28/23    ED Course and Medical Decision Making  Assumed care of patient at sign-out or upon transfer.  Syncope, CT head showing abnormal bleeding pattern, neurology recommending MRI for further evaluation.  Patient takes Plavix.  6:45 AM update: Patient undergoing thorough evaluation with CT, CTA, MRI, EEG.  Neurology also recommending neurosurgery recommendations.  Spoke with PA Esperanza Richters and neurosurgery is recommending echocardiogram to evaluate for PFO.  The theory or possibility is that the bleeding pattern is a result of hemorrhagic conversion of an ischemic stroke and so the echocardiogram would be helpful to evaluate for PFO.  Per neurology, patient will be here in the emergency department until 10 AM waiting for EEG results and so we will attempt to obtain echo during this waiting time.  Patient has had multiple imaging studies of the brain separated by time with no signs of worsening bleeding and so per neurosurgery does not need repeat imaging and patient can follow-up in outpatient setting after echo and EEG so long as no concerning results.  Signed out to oncoming provider at shift change.  Procedures  Final Clinical Impressions(s) / ED Diagnoses     ICD-10-CM   1. Syncope, unspecified syncope type  R55     2. Intracranial bleed [facility])  I62.9       ED Discharge Orders     None       Discharge Instructions   None     Elmer Sow. Pilar Plate, MD [facility] Emergency Medicine [facility] [email or web address]    Sabas Sous, MD 03/28/23 [phone]
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
## 2023-03-28 NOTE — Discharge Instructions (Addendum)
 You were seen in the emergency department after getting lightheaded and losing consciousness yesterday.  You had several blood test and scans done in the hospital.  This included a CT scan of your head, MRI of your brain, and CT angiogram of your brain, due to the fact that there was a small amount of bleeding seen on your brain.  This bleeding was felt to be small and stable enough to discharge you home - but you will need neurosurgery office follow up in 1 week.  Please call the phone number above to schedule follow-up appointment with the neurosurgeon.     At the recommendation of the neurosurgeon, you should STOP taking your aspirin and clopidogrel (plavix) medications for the next 7 days, restarting them on March 6th.  This is to prevent further bleeding risk on your brain. You should continue your other normal medications.  If you have worsening headache, loss of consciousness, strokelike symptoms, difficulty with speech, numbness or weakness of your arms or legs, please call 911 or return to the emergency department immediately.   *  I recommend staying well hydrated and avoiding intense activity or exercise for the next 7 days until you are seen by the neurosurgeon.  Avoid any contact sports or activities that put you at risk of head injury.  *  I also recommend follow-up with your primary care doctor about anemia.  Your hemoglobin count was 9.9 which is slightly lower than normal today.  This may be due to multiple reasons, but I prescribed you iron pills in the meantime to help your body make more blood.  *  Finally, I referred you to your cardiologist for potential long-term heart monitoring.  Referral was placed to the office.  They should contact you within 2-3 business days to set up an appointment.

## 2023-03-28 NOTE — Progress Notes (Signed)
  Echocardiogram 2D Echocardiogram has been performed.  Delcie Roch 03/28/2023, 12:38 PM

## 2023-04-04 ENCOUNTER — Encounter: Payer: Self-pay | Admitting: Vascular Surgery

## 2023-04-10 ENCOUNTER — Ambulatory Visit: Attending: Cardiology | Admitting: Cardiology

## 2023-04-10 ENCOUNTER — Encounter: Payer: Self-pay | Admitting: Cardiology

## 2023-04-10 VITALS — BP 98/67 | HR 71 | Resp 16 | Ht 67.0 in | Wt 160.0 lb

## 2023-04-10 DIAGNOSIS — I739 Peripheral vascular disease, unspecified: Secondary | ICD-10-CM | POA: Diagnosis not present

## 2023-04-10 DIAGNOSIS — E7800 Pure hypercholesterolemia, unspecified: Secondary | ICD-10-CM

## 2023-04-10 DIAGNOSIS — R55 Syncope and collapse: Secondary | ICD-10-CM | POA: Diagnosis not present

## 2023-04-10 DIAGNOSIS — I10 Essential (primary) hypertension: Secondary | ICD-10-CM | POA: Diagnosis not present

## 2023-04-10 NOTE — Progress Notes (Signed)
 Cardiology Office Note:  .   Date:  04/10/2023  ID:  [name], DOB 05/11/1955, MRN [id number] PCP: Raymon Mutton., FNP  [facility] Providers Cardiologist:  Yates Decamp, MD   History of Present Illness: .   [name] is a 68 y.o. 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  Second episode was followed by falling backwards and hit his head and woke up with a headache and confused.  He was taken to the emergency room where CT scan revealed a 8 mm hemorrhagic focus below the left tentorium and felt to be traumatic in nature.  He also had abnormal EKG hence referral was made for cardiology follow-up.  Discussed the use of AI scribe software for clinical note transcription with the patient, who gave verbal consent to proceed.  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 He reports feeling "light-headed" and dizzy prior to the event, which led to him falling backwards and hitting his head. He was subsequently diagnosed with a brain bleed. He attributes the dizziness to a medication he was taking for leg pain related to peripheral artery disease (PAD), which he has since discontinued. He has not experienced any further episodes of dizziness or syncope since the incident and has returned to work. He denies any current leg or back pain and is able to walk without difficulty. He is currently on blood thinners and cholesterol medication. He denies any history of diabetes, smoking, or vaping. He has a history  of PAD, for which he has a stent.      Labs   Lab Results  Component Value Date   CHOL 89 12/16/2019   HDL 32 (L) 12/16/2019   LDLCALC 41 12/16/2019   LDLDIRECT 168 (H) 11/03/2009   TRIG 81 12/16/2019   CHOLHDL 2.8 12/16/2019   Lab Results  Component Value Date   NA 140 03/27/2023   K 4.6 03/28/2023   CO2 17 (L) 03/27/2023   GLUCOSE 93 03/27/2023   BUN 20 03/27/2023   CREATININE 1.42 (H) 03/27/2023   CALCIUM 7.3 (L) 03/27/2023   GFRNONAA 54 (L) 03/27/2023      Latest Ref Rng & Units 03/28/2023   11:44 AM 03/27/2023    3:04 PM 04/09/2022    9:00 AM  BMP  Glucose 70 - 99 mg/dL  93  409   BUN 8 - 23 mg/dL  20  12   Creatinine 8.11 - 1.24 mg/dL  9.14  7.82   Sodium 956 - 145 mmol/L  140  137   Potassium 3.5 - 5.1 mmol/L 4.6  2.9  3.7   Chloride 98 - 111 mmol/L  113  102   CO2 22 - 32 mmol/L  17  27   Calcium 8.9 - 10.3 mg/dL  7.3  9.6       Latest Ref Rng & Units 03/27/2023    3:04 PM 04/09/2022    9:00 AM 05/11/2020    8:03 AM  CBC  WBC 4.0 - 10.5 K/uL 5.8  4.1    Hemoglobin  13.0 - 17.0 g/dL 9.9  96.0  45.4   Hematocrit 39.0 - 52.0 % 29.4  43.3  39.0   Platelets 150 - 400 K/uL 212  318     No results found for: "HGBA1C"  No results found for: "TSH"   External Labs:  ANA  Review of Systems  Cardiovascular:  Negative for chest pain, dyspnea on exertion and leg swelling.   Physical Exam:   VS:  BP 98/67 (BP Location: Left Arm, Patient Position: Sitting, Cuff Size: Normal)   Pulse 71   Resp 16   Ht 5\' 7"  (1.702 m)   Wt 160 lb (72.6 kg)   BMI 25.06 kg/m    Wt Readings from Last 3 Encounters:  04/10/23 160 lb (72.6 kg)  03/27/23 160 lb (72.6 kg)  11/13/22 164 lb 9.6 oz (74.7 kg)    Physical Exam Neck:     Vascular: No carotid bruit or JVD.  Cardiovascular:     Rate and Rhythm: Normal rate and regular rhythm.     Pulses: Intact distal pulses.          Radial pulses are 0 on the left side.       Femoral pulses are 2+ on the right side and 0 on the left  side.      Popliteal pulses are 0 on the right side and 0 on the left side.       Dorsalis pedis pulses are 0 on the right side and 0 on the left side.       Posterior tibial pulses are 0 on the right side and 0 on the left side.     Heart sounds: Normal heart sounds. No murmur heard.    No gallop.  Pulmonary:     Effort: Pulmonary effort is normal.     Breath sounds: Normal breath sounds.  Abdominal:     General: Bowel sounds are normal.     Palpations: Abdomen is soft.  Musculoskeletal:     Right lower leg: No edema.     Left lower leg: No edema.    Studies Reviewed: Marland Kitchen    ECHOCARDIOGRAM COMPLETE 03/28/2023  1. Left ventricular ejection fraction, by estimation, is 65 to 70%. The left ventricle has normal function. The left ventricle has no regional wall motion abnormalities. Left ventricular diastolic parameters were normal. 2. Right ventricular systolic function is normal. The right ventricular size is normal. 3. Billowing mitral valve. The mitral valve is grossly normal. Trivial mitral valve regurgitation. No evidence of mitral stenosis. 4. The aortic valve is tricuspid. There is mild thickening of the aortic valve. Aortic valve regurgitation is not visualized. Aortic valve sclerosis is present, with no evidence of aortic valve stenosis.  EKG:    EKG Interpretation Date/Time:  Wednesday April 10 2023 13:30:01 EDT Ventricular Rate:  71 PR Interval:  154 QRS Duration:  82 QT Interval:  382 QTC Calculation: 415 R Axis:   82  Text Interpretation: EKG 04/10/2023: Normal sinus rhythm at rate of 71 bpm, normal axis.  Incomplete right bundle branch block.  Early repolarization.  No evidence of ischemia.  Normal EKG.  Compared to 03/27/2023, no significant change. Confirmed by Delrae Rend [phone]) on 04/10/2023 1:42:43 PM    Medications and allergies    No Known Allergies   Current Outpatient Medications:    allopurinol (ZYLOPRIM) 300 MG tablet, Take 300 mg by mouth daily.,  Disp: , Rfl:    aspirin EC 81 MG tablet, Take 1 tablet (  81 mg total) by mouth daily. Swallow whole., Disp: 30 tablet, Rfl: 11   atorvastatin (LIPITOR) 40 MG tablet, Take 40 mg by mouth daily., Disp: , Rfl:    clopidogrel (PLAVIX) 75 MG tablet, Take 1 tablet (75 mg total) by mouth daily., Disp: 30 tablet, Rfl: 11   ferrous sulfate 325 (65 FE) MG EC tablet, Take 325 mg by mouth daily with breakfast., Disp: , Rfl:    lisinopril (ZESTRIL) 10 MG tablet, Take 10 mg by mouth daily., Disp: , Rfl:    ASSESSMENT AND PLAN: .      ICD-10-CM   1. Syncope and collapse  R55 CBC    MYOCARDIAL PERFUSION IMAGING    Cardiac Stress Test: Informed Consent Details: Physician/Practitioner Attestation; Transcribe to consent form and obtain patient signature    2. PAD (peripheral artery disease) (HCC)  I73.9 EKG 12-Lead    CBC    3. Primary hypertension  I10 CBC    4. Pure hypercholesterolemia  E78.00 Lipid panel    Lipoprotein A (LPA)    CBC     Assessment and Plan    Syncope and collapse   He experienced a syncopal episode resulting in a fall and intracranial bleeding, likely due to cilostazol, or vasovagal episode presided by dizziness and lightheadedness. Since stopping cilostazol, no further dizziness or syncope has occurred. He should maintain an accurate medication list as there was issues with reconciliation of meds as patient has stopped Cilostazol since the episode of syncope. Perform an exercise nuclear stress test to evaluate cardiac function and conduct blood work, including CBC, lipid profile, and LPA.  Peripheral artery disease (PAD)   He has PAD with a stent in his leg and was previously on cilostazol for leg pain, which was discontinued due to adverse effects. His current walking ability is not limited by leg pain. Further cardiac assessment is required due to the lack of recent stress tests or cardiac evaluations. He had markedly abnormal physical exam with absent iliac/femoral and pedal  pulses but denies claudication. His vascular issues are being followed by vascular surgery.   Hypercholesterolemia   He is on cholesterol medication, indicating hypercholesterolemia. Continued management and monitoring of lipid levels are necessary. Conduct a lipid profile as part of blood work along with obtaining Lp(a)  Follow-up   He requires a follow-up to review test results and ensure proper management of his conditions. Schedule a follow-up appointment in 6-8 weeks to review stress test and blood work results.  If the tests are low risk are normal, I will see him back on appearing basis after the appointment.         Signed,  Yates Decamp, MD, [facility] 04/10/2023, 8:04 PM [facility] [address] [place], [place] [address] Phone: [phone]. Fax:  [phone]

## 2023-04-10 NOTE — Patient Instructions (Signed)
 Medication Instructions:  Your physician recommends that you continue on your current medications as directed. Please refer to the Current Medication list given to you today.  *If you need a refill on your cardiac medications before your next appointment, please call your pharmacy*  Lab Work: TODAY: CBC, Lipid panel, LP(a) If you have labs (blood work) drawn today and your tests are completely normal, you will receive your results only by: MyChart Message (if you have MyChart) OR A paper copy in the mail If you have any lab test that is abnormal or we need to change your treatment, we will call you to review the results.  Testing/Procedures: Your physician has requested that you have an exercise stress myoview. For further information please visit [email or web address] Please follow instruction sheet, as given.   Follow-Up: At [facility], you and your health needs are our priority.  As part of our continuing mission to provide you with exceptional heart care, we have created designated Provider Care Teams.  These Care Teams include your primary Cardiologist (physician) and Advanced Practice Providers (APPs -  Physician Assistants and Nurse Practitioners) who all work together to provide you with the care you need, when you need it.  Your next appointment:   6-8 week(s)  The format for your next appointment:   In Person  Provider:   Yates Decamp, MD {  Other Instructions Exercise Myoview (Stress Test) Instructions  Please arrive 15 minutes prior to your appointment time for registration and insurance purposes.   The test will take approximately 3 to 4 hours to complete; you may bring reading material.  If someone comes with you to your appointment, they will need to remain in the main lobby due to limited space in the testing area. **If you are pregnant or breastfeeding, please notify the nuclear lab prior to your appointment**   How to prepare for your Myocardial Perfusion Test: Do  not eat or drink 3 hours prior to your test, except you may have water. Do not consume products containing caffeine (regular or decaffeinated) 12 hours prior to your test. (ex: coffee, chocolate, sodas, tea). Do bring a list of your current medications with you.  If not listed below, you may take your medications as normal. Do wear comfortable clothes (no dresses or overalls) and walking shoes, tennis shoes preferred (No heels or open toe shoes are allowed). Do NOT wear cologne, perfume, aftershave, or lotions (deodorant is allowed). If these instructions are not followed, your test will have to be rescheduled.   Please report to [address] for your test.  If you have questions or concerns about your appointment, you can call the Nuclear Lab at [phone].   If you cannot keep your appointment, please provide 24 hours notification to the Nuclear Lab, to avoid a possible $50 charge to your account.      1st Floor: - Lobby - Registration  - Pharmacy  - Lab - Cafe  2nd Floor: - PV Lab - Diagnostic Testing (echo, CT, nuclear med)  3rd Floor: - Vacant  4th Floor: - TCTS (cardiothoracic surgery) - AFib Clinic - Structural Heart Clinic - Vascular Surgery  - Vascular Ultrasound  5th Floor: - HeartCare Cardiology (general and EP) - Clinical Pharmacy for coumadin, hypertension, lipid, weight-loss medications, and med management appointments    Valet parking services will be available as well.

## 2023-04-11 ENCOUNTER — Encounter: Payer: Self-pay | Admitting: Cardiology

## 2023-04-11 DIAGNOSIS — E7800 Pure hypercholesterolemia, unspecified: Secondary | ICD-10-CM

## 2023-04-11 MED ORDER — EZETIMIBE 10 MG PO TABS: 10.0000 mg | ORAL_TABLET | Freq: Every day | ORAL | 6 refills | Status: AC

## 2023-04-11 NOTE — Telephone Encounter (Signed)
-----   Message from Yates Decamp sent at 04/11/2023  1:18 PM EDT ----- Cholesterol is still elevated, LDL goal is <70 especially in view of peripheral arterial disease, add Zetia 10 mg daily.  Sent for 90 days prescription with no refills, lipids prior to his next office visit.

## 2023-04-11 NOTE — Progress Notes (Signed)
 Cholesterol is still elevated, LDL goal is <70 especially in view of peripheral arterial disease, add Zetia 10 mg daily.  Sent for 90 days prescription with no refills, lipids prior to his next office visit.

## 2023-04-11 NOTE — Telephone Encounter (Signed)
 Patient notified.  Prescription sent to [facility].  Appointment made for patient to see Dr Jacinto Halim on 5/15 for follow up.  Patient will have lab work checked a few days prior to this appointment

## 2023-04-12 ENCOUNTER — Encounter (HOSPITAL_COMMUNITY): Payer: Self-pay

## 2023-04-12 LAB — CBC
Hematocrit: 41 % (ref 37.5–51.0)
Hemoglobin: 13.8 g/dL (ref 13.0–17.7)
MCH: 31.4 pg (ref 26.6–33.0)
MCHC: 33.7 g/dL (ref 31.5–35.7)
MCV: 93 fL (ref 79–97)
Platelets: 296 10*3/uL (ref 150–450)
RBC: 4.4 x10E6/uL (ref 4.14–5.80)
RDW: 12.7 % (ref 11.6–15.4)
WBC: 4.3 10*3/uL (ref 3.4–10.8)

## 2023-04-12 LAB — LIPID PANEL
Chol/HDL Ratio: 2.7 ratio (ref 0.0–5.0)
Cholesterol, Total: 148 mg/dL (ref 100–199)
HDL: 54 mg/dL (ref >39)
LDL Chol Calc (NIH): 79 mg/dL (ref 0–99)
Triglycerides: 78 mg/dL (ref 0–149)
VLDL Cholesterol Cal: 15 mg/dL (ref 5–40)

## 2023-04-12 LAB — LIPOPROTEIN A (LPA): Lipoprotein (a): 102 nmol/L — ABNORMAL HIGH (ref <75.0)

## 2023-04-19 ENCOUNTER — Encounter: Payer: Self-pay | Admitting: Cardiology

## 2023-04-19 ENCOUNTER — Ambulatory Visit (HOSPITAL_COMMUNITY): Attending: Internal Medicine

## 2023-04-19 DIAGNOSIS — R55 Syncope and collapse: Secondary | ICD-10-CM | POA: Insufficient documentation

## 2023-04-19 LAB — MYOCARDIAL PERFUSION IMAGING
Angina Index: 0
Duke Treadmill Score: 5
Estimated workload: 7
Exercise duration (min): 5 min
Exercise duration (sec): 11 s
LV dias vol: 81 mL (ref 62–150)
LV sys vol: 28 mL
MPHR: 152 {beats}/min
Nuc Stress EF: 65 %
Peak HR: 150 {beats}/min
Percent HR: 98 %
Rest HR: 60 {beats}/min
Rest Nuclear Isotope Dose: 10.2 mCi
SDS: 1
SRS: 2
SSS: 3
ST Depression (mm): 0 mm
Stress Nuclear Isotope Dose: 32.6 mCi
TID: 1.01

## 2023-04-19 MED ORDER — TECHNETIUM TC 99M TETROFOSMIN IV KIT
32.6000 | PACK | Freq: Once | INTRAVENOUS | Status: AC | PRN
Start: 2023-04-19 — End: 2023-04-19
  Administered 2023-04-19: 32.6 via INTRAVENOUS

## 2023-04-19 MED ORDER — TECHNETIUM TC 99M TETROFOSMIN IV KIT
10.2000 | PACK | Freq: Once | INTRAVENOUS | Status: AC | PRN
Start: 2023-04-19 — End: 2023-04-19
  Administered 2023-04-19: 10.2 via INTRAVENOUS

## 2023-04-19 NOTE — Progress Notes (Signed)
 Normal nuclear stress test, [name] was able to find minutes of exercise on Bruce protocol and achieved 7 METS.  Overall low risk, continue medical therapy for now.

## 2023-05-21 ENCOUNTER — Encounter (HOSPITAL_COMMUNITY): Payer: Medicare HMO

## 2023-05-21 ENCOUNTER — Ambulatory Visit: Payer: Medicare HMO | Admitting: Vascular Surgery

## 2023-05-28 ENCOUNTER — Ambulatory Visit: Admitting: Vascular Surgery

## 2023-05-28 ENCOUNTER — Encounter (HOSPITAL_COMMUNITY)

## 2023-06-07 ENCOUNTER — Ambulatory Visit (HOSPITAL_COMMUNITY)
Admission: RE | Admit: 2023-06-07 | Discharge: 2023-06-07 | Disposition: A | Discharge status: Home / Self Care | Source: Ambulatory Visit | Attending: Vascular Surgery

## 2023-06-07 DIAGNOSIS — I739 Peripheral vascular disease, unspecified: Secondary | ICD-10-CM | POA: Insufficient documentation

## 2023-06-07 DIAGNOSIS — I7409 Other arterial embolism and thrombosis of abdominal aorta: Secondary | ICD-10-CM | POA: Diagnosis present

## 2023-06-07 LAB — VAS US ABI WITH/WO TBI
Left ABI: 0.75
Right ABI: 0.7
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
## 2023-06-11 ENCOUNTER — Ambulatory Visit: Attending: Vascular Surgery | Admitting: Vascular Surgery

## 2023-06-11 ENCOUNTER — Encounter: Payer: Self-pay | Admitting: Vascular Surgery

## 2023-06-11 VITALS — BP 109/76 | HR 74 | Temp 98.3°F | Resp 20 | Ht 67.0 in | Wt 170.0 lb

## 2023-06-11 DIAGNOSIS — I739 Peripheral vascular disease, unspecified: Secondary | ICD-10-CM | POA: Diagnosis not present

## 2023-06-13 ENCOUNTER — Other Ambulatory Visit: Payer: Self-pay | Admitting: *Deleted

## 2023-06-13 ENCOUNTER — Ambulatory Visit: Attending: Cardiology | Admitting: Cardiology

## 2023-06-13 ENCOUNTER — Encounter: Payer: Self-pay | Admitting: Cardiology

## 2023-06-13 VITALS — BP 108/70 | HR 66 | Resp 16 | Ht 67.0 in | Wt 171.4 lb

## 2023-06-13 DIAGNOSIS — E7800 Pure hypercholesterolemia, unspecified: Secondary | ICD-10-CM

## 2023-06-13 DIAGNOSIS — R55 Syncope and collapse: Secondary | ICD-10-CM

## 2023-06-13 DIAGNOSIS — I10 Essential (primary) hypertension: Secondary | ICD-10-CM

## 2023-06-13 MED ORDER — ATORVASTATIN CALCIUM 80 MG PO TABS: 80.0000 mg | ORAL_TABLET | Freq: Every day | ORAL | 0 refills | Status: AC

## 2023-06-13 NOTE — Patient Instructions (Signed)
 Medication Instructions:  Your physician has recommended you make the following change in your medication:  Stop aspirin  Increase atorvastatin  to 80 mg by mouth daily   *If you need a refill on your cardiac medications before your next appointment, please call your pharmacy*  Lab Work: Have fasting lab work checked in 2 months. Lipid profile.  Can be done at any [facility] location.  There is an office on the first floor of our building If you have labs (blood work) drawn today and your tests are completely normal, you will receive your results only by: MyChart Message (if you have MyChart) OR A paper copy in the mail If you have any lab test that is abnormal or we need to change your treatment, we will call you to review the results.  Testing/Procedures: none  Follow-Up: At [facility], you and your health needs are our priority.  As part of our continuing mission to provide you with exceptional heart care, our providers are all part of one team.  This team includes your primary Cardiologist (physician) and Advanced Practice Providers or APPs (Physician Assistants and Nurse Practitioners) who all work together to provide you with the care you need, when you need it.  Your next appointment:   As needed  Provider:   Knox Perl, MD    We recommend signing up for the patient portal called "MyChart".  Sign up information is provided on this After Visit Summary.  MyChart is used to connect with patients for Virtual Visits (Telemedicine).  Patients are able to view lab/test results, encounter notes, upcoming appointments, etc.  Non-urgent messages can be sent to your provider as well.   To learn more about what you can do with MyChart, go to [email or web address].   Other Instructions
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
## 2023-12-18 ENCOUNTER — Other Ambulatory Visit: Payer: Self-pay | Admitting: Vascular Surgery
